# Patient Record
Sex: Male | Born: 1955
Health system: Southern US, Community
[De-identification: ages and names within clinical notes are randomized; demographics above are authoritative.]

## PROBLEM LIST (undated history)

## (undated) DIAGNOSIS — I739 Peripheral vascular disease, unspecified: Secondary | ICD-10-CM

## (undated) DIAGNOSIS — I2699 Other pulmonary embolism without acute cor pulmonale: Secondary | ICD-10-CM

## (undated) DIAGNOSIS — I10 Essential (primary) hypertension: Secondary | ICD-10-CM

## (undated) DIAGNOSIS — Z87442 Personal history of urinary calculi: Secondary | ICD-10-CM

## (undated) DIAGNOSIS — E079 Disorder of thyroid, unspecified: Secondary | ICD-10-CM

## (undated) DIAGNOSIS — C801 Malignant (primary) neoplasm, unspecified: Secondary | ICD-10-CM

## (undated) DIAGNOSIS — R0600 Dyspnea, unspecified: Secondary | ICD-10-CM

## (undated) DIAGNOSIS — C4491 Basal cell carcinoma of skin, unspecified: Secondary | ICD-10-CM

## (undated) DIAGNOSIS — E039 Hypothyroidism, unspecified: Secondary | ICD-10-CM

## (undated) DIAGNOSIS — N189 Chronic kidney disease, unspecified: Secondary | ICD-10-CM

## (undated) DIAGNOSIS — M1990 Unspecified osteoarthritis, unspecified site: Secondary | ICD-10-CM

## (undated) HISTORY — PX: NO PAST SURGERIES: SHX2092

## (undated) HISTORY — DX: Other pulmonary embolism without acute cor pulmonale: I26.99

## (undated) HISTORY — DX: Malignant (primary) neoplasm, unspecified: C80.1

## (undated) HISTORY — DX: Disorder of thyroid, unspecified: E07.9

## (undated) HISTORY — DX: Basal cell carcinoma of skin, unspecified: C44.91

## (undated) HISTORY — PX: OTHER SURGICAL HISTORY: SHX169

## (undated) HISTORY — DX: Chronic kidney disease, unspecified: N18.9

---

## 1990-07-14 HISTORY — PX: WISDOM TOOTH EXTRACTION: SHX21

## 2009-10-30 ENCOUNTER — Ambulatory Visit: Payer: Self-pay | Admitting: Internal Medicine

## 2009-10-30 LAB — CBC WITH DIFFERENTIAL/PLATELET
BASO%: 2 % (ref 0.0–2.0)
Basophils Absolute: 0.1 10*3/uL (ref 0.0–0.1)
EOS%: 1.7 % (ref 0.0–7.0)
Eosinophils Absolute: 0.1 10*3/uL (ref 0.0–0.5)
HCT: 48 % (ref 38.4–49.9)
HGB: 16.7 g/dL (ref 13.0–17.1)
LYMPH%: 28.5 % (ref 14.0–49.0)
MCH: 34 pg — ABNORMAL HIGH (ref 27.2–33.4)
MCHC: 34.8 g/dL (ref 32.0–36.0)
MCV: 97.5 fL (ref 79.3–98.0)
MONO#: 0.6 10*3/uL (ref 0.1–0.9)
MONO%: 9.7 % (ref 0.0–14.0)
NEUT#: 3.6 10*3/uL (ref 1.5–6.5)
NEUT%: 58.1 % (ref 39.0–75.0)
Platelets: 254 10*3/uL (ref 140–400)
RBC: 4.92 10*6/uL (ref 4.20–5.82)
RDW: 12.6 % (ref 11.0–14.6)
WBC: 6.2 10*3/uL (ref 4.0–10.3)
lymph#: 1.8 10*3/uL (ref 0.9–3.3)

## 2009-11-01 LAB — COMPREHENSIVE METABOLIC PANEL
ALT: 24 U/L (ref 0–53)
AST: 19 U/L (ref 0–37)
Albumin: 4.4 g/dL (ref 3.5–5.2)
Alkaline Phosphatase: 52 U/L (ref 39–117)
BUN: 23 mg/dL (ref 6–23)
CO2: 21 mEq/L (ref 19–32)
Calcium: 9 mg/dL (ref 8.4–10.5)
Chloride: 104 mEq/L (ref 96–112)
Creatinine, Ser: 1.52 mg/dL — ABNORMAL HIGH (ref 0.40–1.50)
Glucose, Bld: 125 mg/dL — ABNORMAL HIGH (ref 70–99)
Potassium: 4.5 mEq/L (ref 3.5–5.3)
Sodium: 138 mEq/L (ref 135–145)
Total Bilirubin: 0.6 mg/dL (ref 0.3–1.2)
Total Protein: 7.4 g/dL (ref 6.0–8.3)

## 2009-11-01 LAB — KAPPA/LAMBDA LIGHT CHAINS
Kappa free light chain: 1 mg/dL (ref 0.33–1.94)
Kappa:Lambda Ratio: 1.19 (ref 0.26–1.65)
Lambda Free Lght Chn: 0.84 mg/dL (ref 0.57–2.63)

## 2009-11-01 LAB — LACTATE DEHYDROGENASE: LDH: 167 U/L (ref 94–250)

## 2009-11-01 LAB — BETA 2 MICROGLOBULIN, SERUM: Beta-2 Microglobulin: 1.74 mg/L — ABNORMAL HIGH (ref 1.01–1.73)

## 2009-11-01 LAB — IMMUNOFIXATION ELECTROPHORESIS
IgA: 388 mg/dL — ABNORMAL HIGH (ref 68–378)
IgG (Immunoglobin G), Serum: 1340 mg/dL (ref 694–1618)
IgM, Serum: 154 mg/dL (ref 60–263)
Total Protein, Serum Electrophoresis: 7.4 g/dL (ref 6.0–8.3)

## 2009-11-07 LAB — UIFE/LIGHT CHAINS/TP QN, 24-HR UR
Albumin, U: DETECTED
Alpha 1, Urine: DETECTED — AB
Alpha 2, Urine: DETECTED — AB
Beta, Urine: DETECTED — AB
Free Kappa Lt Chains,Ur: 3.21 mg/dL — ABNORMAL HIGH (ref 0.04–1.51)
Free Kappa/Lambda Ratio: 16.89 ratio — ABNORMAL HIGH (ref 0.46–4.00)
Free Lambda Excretion/Day: 2.57 mg/d
Free Lambda Lt Chains,Ur: 0.19 mg/dL (ref 0.08–1.01)
Free Lt Chn Excr Rate: 43.34 mg/d
Gamma Globulin, Urine: DETECTED — AB
Time: 24 hours
Total Protein, Urine-Ur/day: 53 mg/d (ref 10–140)
Total Protein, Urine: 3.9 mg/dL
Volume, Urine: 1350 mL

## 2011-10-01 DIAGNOSIS — Z860100 Personal history of colon polyps, unspecified: Secondary | ICD-10-CM

## 2011-10-01 HISTORY — DX: Personal history of colon polyps, unspecified: Z86.0100

## 2011-10-01 HISTORY — DX: Personal history of colonic polyps: Z86.010

## 2013-07-14 DIAGNOSIS — I2699 Other pulmonary embolism without acute cor pulmonale: Secondary | ICD-10-CM | POA: Insufficient documentation

## 2013-07-14 HISTORY — DX: Other pulmonary embolism without acute cor pulmonale: I26.99

## 2015-07-30 DIAGNOSIS — E088 Diabetes mellitus due to underlying condition with unspecified complications: Secondary | ICD-10-CM

## 2015-07-30 HISTORY — DX: Diabetes mellitus due to underlying condition with unspecified complications: E08.8

## 2015-11-26 ENCOUNTER — Encounter: Payer: Self-pay | Admitting: Gastroenterology

## 2015-11-28 HISTORY — PX: COLONOSCOPY: SHX174

## 2016-02-01 DIAGNOSIS — Z7901 Long term (current) use of anticoagulants: Secondary | ICD-10-CM | POA: Diagnosis not present

## 2016-02-01 DIAGNOSIS — Z86718 Personal history of other venous thrombosis and embolism: Secondary | ICD-10-CM | POA: Diagnosis not present

## 2016-02-01 DIAGNOSIS — Z832 Family history of diseases of the blood and blood-forming organs and certain disorders involving the immune mechanism: Secondary | ICD-10-CM | POA: Diagnosis not present

## 2016-02-01 DIAGNOSIS — Z86711 Personal history of pulmonary embolism: Secondary | ICD-10-CM | POA: Diagnosis not present

## 2017-04-07 ENCOUNTER — Ambulatory Visit (INDEPENDENT_AMBULATORY_CARE_PROVIDER_SITE_OTHER): Payer: BLUE CROSS/BLUE SHIELD | Admitting: Cardiology

## 2017-04-07 ENCOUNTER — Encounter: Payer: Self-pay | Admitting: Cardiology

## 2017-04-07 VITALS — BP 134/72 | HR 90 | Ht 68.0 in | Wt 181.0 lb

## 2017-04-07 DIAGNOSIS — E782 Mixed hyperlipidemia: Secondary | ICD-10-CM | POA: Diagnosis not present

## 2017-04-07 DIAGNOSIS — E088 Diabetes mellitus due to underlying condition with unspecified complications: Secondary | ICD-10-CM

## 2017-04-07 DIAGNOSIS — E785 Hyperlipidemia, unspecified: Secondary | ICD-10-CM

## 2017-04-07 DIAGNOSIS — I2699 Other pulmonary embolism without acute cor pulmonale: Secondary | ICD-10-CM | POA: Diagnosis not present

## 2017-04-07 HISTORY — DX: Hyperlipidemia, unspecified: E78.5

## 2017-04-07 NOTE — Progress Notes (Signed)
Cardiology Office Note:    Date:  04/07/2017   ID:  [name], DOB 01-20-1956, MRN [id number]  PCP:  Nicoletta Dress, MD  Cardiologist:  Jenean Lindau, MD   Referring MD: Nicoletta Dress, MD    ASSESSMENT:    1. Other pulmonary embolism without acute cor pulmonale, unspecified chronicity ([place])   2. Diabetes mellitus due to underlying condition with complication, unspecified whether long term insulin use ([place])   3. Mixed hyperlipidemia    PLAN:    In order of problems listed above:  1. Pulmonary embolism issues were discussed. Patient is on anticoagulation Benefits and potential this explained and he vocalized understanding. 2. I discussed with the patient that his blood pressure heart rate is mildly elevated. His wife is a Marine scientist and he will keep her track of this in the next 2 weeks and send me her record. He mentions to me currently. His blood pressures and heart rate are much better than what are obtained in our clinic today. 3. Diet was discussed for diabetes mellitus and dyslipidemia. Importance of regular exercise stressed. These are followed by his primary care physician. Patient will be seen in follow-up appointment on an annual basis or earlier if he has any concerns.   Medication Adjustments/Labs and Tests Ordered: Current medicines are reviewed at length with the patient today.  Concerns regarding medicines are outlined above.  No orders of the defined types were placed in this encounter.  No orders of the defined types were placed in this encounter.    History of Present Illness:    Brandon Norman is a 61 y.o. male who is being seen today for the evaluation of follow up for pulmonary embolism history at the request of Nicoletta Dress, MD. Patient is a pleasant 61 year old male. He has past medical history of pulmonary embolism.Patient also has history of diabetes mellitus and dyslipidemia. He mentions to me that he walks more than 2.5 miles a day on  a daily basis. No chest pain orthopnea or PND. At the time of my evaluation is alert awake oriented and in no distress. I have taken care of this gentleman in the past and now he wants to be established with me for his care  Past Medical History:  Diagnosis Date  . Pulmonary embolism ([place]) 2015    History reviewed. No pertinent surgical history.  Current Medications: Current Meds  Medication Sig  . atorvastatin (LIPITOR) 20 MG tablet Take 20 mg by mouth daily.  . fenofibrate 160 MG tablet Take 160 mg by mouth daily.  . Levothyroxine Sodium 75 MCG CAPS Take 75 mcg by mouth daily.  . rivaroxaban (XARELTO) 20 MG TABS tablet Take 20 mg by mouth daily.  . traMADol (ULTRAM) 50 MG tablet Take 50 mg by mouth daily as needed.     Allergies:   Patient has no known allergies.   Social History   Social History  . Marital status: Married    Spouse name: N/A  . Number of children: N/A  . Years of education: N/A   Social History Main Topics  . Smoking status: Never Smoker  . Smokeless tobacco: Never Used  . Alcohol use Yes     Comment: occ  . Drug use: No  . Sexual activity: Not Asked   Other Topics Concern  . None   Social History Narrative  . None     Family History: The patient's family history includes Heart attack in his mother.  ROS:   Please see the history of present illness.    All other systems reviewed and are negative.  EKGs/Labs/Other Studies Reviewed:    The following studies were reviewed today: I discussed previous evaluation and reviewed records with the patient at length. He recently had blood work with his primary care physician and we will send me a copy of it.   Recent Labs: No results found for requested labs within last 8760 hours.  Recent Lipid Panel No results found for: CHOL, TRIG, HDL, CHOLHDL, VLDL, LDLCALC, LDLDIRECT  Physical Exam:    VS:  BP 134/72   Pulse 90   Ht 5\' 8"  (1.727 m)   Wt 181 lb 0.6 oz (82.1 kg)   SpO2 98%   BMI 27.53  kg/m     Wt Readings from Last 3 Encounters:  04/07/17 181 lb 0.6 oz (82.1 kg)     GEN: Patient is in no acute distress HEENT: Normal NECK: No JVD; No carotid bruits LYMPHATICS: No lymphadenopathy CARDIAC: S1 S2 regular, 2/6 systolic murmur at the apex. RESPIRATORY:  Clear to auscultation without rales, wheezing or rhonchi  ABDOMEN: Soft, non-tender, non-distended MUSCULOSKELETAL:  No edema; No deformity  SKIN: Warm and dry NEUROLOGIC:  Alert and oriented x 3 PSYCHIATRIC:  Normal affect    Signed, Jenean Lindau, MD  04/07/2017 9:27 AM    [place]

## 2017-04-07 NOTE — Patient Instructions (Signed)
Medication Instructions:  No med changes  Labwork: None  Testing/Procedures: You had an EKG today  Follow-Up: 1 year  Any Other Special Instructions Will Be Listed Below (If Applicable).     If you need a refill on your cardiac medications before your next appointment, please call your pharmacy.

## 2017-05-09 ENCOUNTER — Encounter: Payer: Self-pay | Admitting: Cardiology

## 2018-08-25 DIAGNOSIS — E1122 Type 2 diabetes mellitus with diabetic chronic kidney disease: Secondary | ICD-10-CM | POA: Diagnosis not present

## 2018-08-25 DIAGNOSIS — Z85828 Personal history of other malignant neoplasm of skin: Secondary | ICD-10-CM | POA: Diagnosis not present

## 2018-08-25 DIAGNOSIS — L814 Other melanin hyperpigmentation: Secondary | ICD-10-CM | POA: Diagnosis not present

## 2018-08-25 DIAGNOSIS — D485 Neoplasm of uncertain behavior of skin: Secondary | ICD-10-CM | POA: Diagnosis not present

## 2018-08-25 DIAGNOSIS — D224 Melanocytic nevi of scalp and neck: Secondary | ICD-10-CM | POA: Diagnosis not present

## 2018-08-25 DIAGNOSIS — N183 Chronic kidney disease, stage 3 (moderate): Secondary | ICD-10-CM | POA: Diagnosis not present

## 2018-08-25 DIAGNOSIS — C44311 Basal cell carcinoma of skin of nose: Secondary | ICD-10-CM | POA: Diagnosis not present

## 2018-08-25 DIAGNOSIS — E785 Hyperlipidemia, unspecified: Secondary | ICD-10-CM | POA: Diagnosis not present

## 2018-08-25 DIAGNOSIS — L570 Actinic keratosis: Secondary | ICD-10-CM | POA: Diagnosis not present

## 2018-08-25 DIAGNOSIS — Z08 Encounter for follow-up examination after completed treatment for malignant neoplasm: Secondary | ICD-10-CM | POA: Diagnosis not present

## 2018-10-19 DIAGNOSIS — N183 Chronic kidney disease, stage 3 (moderate): Secondary | ICD-10-CM | POA: Diagnosis not present

## 2018-10-19 DIAGNOSIS — I2692 Saddle embolus of pulmonary artery without acute cor pulmonale: Secondary | ICD-10-CM | POA: Diagnosis not present

## 2018-10-19 DIAGNOSIS — E039 Hypothyroidism, unspecified: Secondary | ICD-10-CM | POA: Diagnosis not present

## 2018-10-19 DIAGNOSIS — E1169 Type 2 diabetes mellitus with other specified complication: Secondary | ICD-10-CM | POA: Diagnosis not present

## 2018-10-19 DIAGNOSIS — E785 Hyperlipidemia, unspecified: Secondary | ICD-10-CM | POA: Diagnosis not present

## 2018-12-01 ENCOUNTER — Ambulatory Visit: Payer: BLUE CROSS/BLUE SHIELD | Admitting: Cardiology

## 2018-12-15 DIAGNOSIS — C44311 Basal cell carcinoma of skin of nose: Secondary | ICD-10-CM | POA: Diagnosis not present

## 2018-12-15 DIAGNOSIS — D485 Neoplasm of uncertain behavior of skin: Secondary | ICD-10-CM | POA: Diagnosis not present

## 2018-12-15 DIAGNOSIS — L989 Disorder of the skin and subcutaneous tissue, unspecified: Secondary | ICD-10-CM | POA: Diagnosis not present

## 2019-01-21 DIAGNOSIS — D225 Melanocytic nevi of trunk: Secondary | ICD-10-CM | POA: Diagnosis not present

## 2019-01-21 DIAGNOSIS — L738 Other specified follicular disorders: Secondary | ICD-10-CM | POA: Diagnosis not present

## 2019-01-21 DIAGNOSIS — L821 Other seborrheic keratosis: Secondary | ICD-10-CM | POA: Diagnosis not present

## 2019-01-21 DIAGNOSIS — L905 Scar conditions and fibrosis of skin: Secondary | ICD-10-CM | POA: Diagnosis not present

## 2019-01-21 DIAGNOSIS — Z85828 Personal history of other malignant neoplasm of skin: Secondary | ICD-10-CM | POA: Diagnosis not present

## 2019-01-21 DIAGNOSIS — L814 Other melanin hyperpigmentation: Secondary | ICD-10-CM | POA: Diagnosis not present

## 2019-01-21 DIAGNOSIS — D485 Neoplasm of uncertain behavior of skin: Secondary | ICD-10-CM | POA: Diagnosis not present

## 2019-01-21 DIAGNOSIS — D1801 Hemangioma of skin and subcutaneous tissue: Secondary | ICD-10-CM | POA: Diagnosis not present

## 2019-01-21 DIAGNOSIS — D2239 Melanocytic nevi of other parts of face: Secondary | ICD-10-CM | POA: Diagnosis not present

## 2019-01-26 ENCOUNTER — Ambulatory Visit: Payer: Self-pay | Admitting: Cardiology

## 2019-01-31 DIAGNOSIS — N183 Chronic kidney disease, stage 3 (moderate): Secondary | ICD-10-CM | POA: Diagnosis not present

## 2019-01-31 DIAGNOSIS — E785 Hyperlipidemia, unspecified: Secondary | ICD-10-CM | POA: Diagnosis not present

## 2019-01-31 DIAGNOSIS — I2692 Saddle embolus of pulmonary artery without acute cor pulmonale: Secondary | ICD-10-CM | POA: Diagnosis not present

## 2019-01-31 DIAGNOSIS — Z9181 History of falling: Secondary | ICD-10-CM | POA: Diagnosis not present

## 2019-01-31 DIAGNOSIS — E039 Hypothyroidism, unspecified: Secondary | ICD-10-CM | POA: Diagnosis not present

## 2019-01-31 DIAGNOSIS — E1169 Type 2 diabetes mellitus with other specified complication: Secondary | ICD-10-CM | POA: Diagnosis not present

## 2019-02-03 ENCOUNTER — Encounter: Payer: Self-pay | Admitting: Cardiology

## 2019-02-03 ENCOUNTER — Other Ambulatory Visit: Payer: Self-pay

## 2019-02-03 ENCOUNTER — Ambulatory Visit (INDEPENDENT_AMBULATORY_CARE_PROVIDER_SITE_OTHER): Payer: Medicare Other | Admitting: Cardiology

## 2019-02-03 VITALS — BP 142/76 | HR 92 | Ht 68.0 in | Wt 179.0 lb

## 2019-02-03 DIAGNOSIS — Z86711 Personal history of pulmonary embolism: Secondary | ICD-10-CM

## 2019-02-03 DIAGNOSIS — R011 Cardiac murmur, unspecified: Secondary | ICD-10-CM | POA: Diagnosis not present

## 2019-02-03 DIAGNOSIS — E782 Mixed hyperlipidemia: Secondary | ICD-10-CM | POA: Diagnosis not present

## 2019-02-03 DIAGNOSIS — E088 Diabetes mellitus due to underlying condition with unspecified complications: Secondary | ICD-10-CM

## 2019-02-03 HISTORY — DX: Personal history of pulmonary embolism: Z86.711

## 2019-02-03 NOTE — Progress Notes (Signed)
Cardiology Office Note:    Date:  02/03/2019   ID:  Brandon Norman, DOB Feb 24, 1956, MRN [id number]  PCP:  Brandon Dress, MD  Cardiologist:  Brandon Lindau, MD   Referring MD: Brandon Dress, MD    ASSESSMENT:    1. Mixed hyperlipidemia   2. History of pulmonary embolism   3. Diabetes mellitus due to underlying condition with unspecified complications ([place])    PLAN:    In order of problems listed above:  1. Primary prevention stressed with the patient.  Importance of compliance with diet and medication stressed and he vocalized understanding.  We will get a copy of all lab work done by his primary care physician. 2. Echocardiogram will be done to assess murmur heard on auscultation. 3. He continues taking his anticoagulation for history of a significant pulmonary embolism.  His blood work is monitored by his primary care physician. 4. Patient will be seen in follow-up appointment in 12 months or earlier if the patient has any concerns    Medication Adjustments/Labs and Tests Ordered: Current medicines are reviewed at length with the patient today.  Concerns regarding medicines are outlined above.  No orders of the defined types were placed in this encounter.  No orders of the defined types were placed in this encounter.    No chief complaint on file.    History of Present Illness:    Brandon Norman is a 63 y.o. male.  Patient has history of pulmonary embolism and dyslipidemia.  He denies any problems at this time and takes care of activities of daily living.  No chest pain orthopnea or PND.  He walks 2-1/2 miles on a daily basis without any symptoms.  At the time of my evaluation, the patient is alert awake oriented and in no distress.  Past Medical History:  Diagnosis Date  . Pulmonary embolism ([place]) 2015    History reviewed. No pertinent surgical history.  Current Medications: Current Meds  Medication Sig  . atorvastatin (LIPITOR) 20 MG tablet Take  20 mg by mouth daily.  . fenofibrate 160 MG tablet Take 160 mg by mouth daily.  Marland Kitchen glimepiride (AMARYL) 2 MG tablet Take 1 tablet by mouth daily.  . Levothyroxine Sodium 75 MCG CAPS Take 75 mcg by mouth daily.  . rivaroxaban (XARELTO) 20 MG TABS tablet Take 20 mg by mouth daily.     Allergies:   Patient has no known allergies.   Social History   Socioeconomic History  . Marital status: Married    Spouse name: Not on file  . Number of children: Not on file  . Years of education: Not on file  . Highest education level: Not on file  Occupational History  . Not on file  Social Needs  . Financial resource strain: Not on file  . Food insecurity    Worry: Not on file    Inability: Not on file  . Transportation needs    Medical: Not on file    Non-medical: Not on file  Tobacco Use  . Smoking status: Never Smoker  . Smokeless tobacco: Never Used  Substance and Sexual Activity  . Alcohol use: Yes    Comment: occ  . Drug use: No  . Sexual activity: Not on file  Lifestyle  . Physical activity    Days per week: Not on file    Minutes per session: Not on file  . Stress: Not on file  Relationships  . Social connections  Talks on phone: Not on file    Gets together: Not on file    Attends religious service: Not on file    Active member of club or organization: Not on file    Attends meetings of clubs or organizations: Not on file    Relationship status: Not on file  Other Topics Concern  . Not on file  Social History Narrative  . Not on file     Family History: The patient's family history includes Heart attack in his mother.  ROS:   Please see the history of present illness.    All other systems reviewed and are negative.  EKGs/Labs/Other Studies Reviewed:    The following studies were reviewed today: We are requesting lab work done recently at primary care office.   Recent Labs: No results found for requested labs within last 8760 hours.  Recent Lipid Panel No  results found for: CHOL, TRIG, HDL, CHOLHDL, VLDL, LDLCALC, LDLDIRECT  Physical Exam:    VS:  BP (!) 142/76 (BP Location: Left Arm, Patient Position: Sitting, Cuff Size: Normal)   Pulse 92   Ht 5\' 8"  (1.727 m)   Wt 179 lb (81.2 kg)   SpO2 98%   BMI 27.22 kg/m     Wt Readings from Last 3 Encounters:  02/03/19 179 lb (81.2 kg)  04/07/17 181 lb 0.6 oz (82.1 kg)     GEN: Patient is in no acute distress HEENT: Normal NECK: No JVD; No carotid bruits LYMPHATICS: No lymphadenopathy CARDIAC: Hear sounds regular, 2/6 systolic murmur at the apex. RESPIRATORY:  Clear to auscultation without rales, wheezing or rhonchi  ABDOMEN: Soft, non-tender, non-distended MUSCULOSKELETAL:  No edema; No deformity  SKIN: Warm and dry NEUROLOGIC:  Alert and oriented x 3 PSYCHIATRIC:  Normal affect   Signed, Brandon Lindau, MD  02/03/2019 2:46 PM    [facility]

## 2019-02-03 NOTE — Addendum Note (Signed)
Addended by: Beckey Rutter on: 02/03/2019 03:14 PM   Modules accepted: Orders

## 2019-02-03 NOTE — Patient Instructions (Addendum)
Medication Instructions:  Your physician recommends that you continue on your current medications as directed. Please refer to the Current Medication list given to you today.  If you need a refill on your cardiac medications before your next appointment, please call your pharmacy.   Lab work: NONE If you have labs (blood work) drawn today and your tests are completely normal, you will receive your results only by: Marland Kitchen MyChart Message (if you have MyChart) OR . A paper copy in the mail If you have any lab test that is abnormal or we need to change your treatment, we will call you to review the results.  Testing/Procedures: YOU had an EKG performed today.    Your physician has requested that you have an echocardiogram. Echocardiography is a painless test that uses sound waves to create images of your heart. It provides your doctor with information about the size and shape of your heart and how well your heart's chambers and valves are working. This procedure takes approximately one hour. There are no restrictions for this procedure.    Follow-Up: At [facility], you and your health needs are our priority.  As part of our continuing mission to provide you with exceptional heart care, we have created designated Provider Care Teams.  These Care Teams include your primary Cardiologist (physician) and Advanced Practice Providers (APPs -  Physician Assistants and Nurse Practitioners) who all work together to provide you with the care you need, when you need it. You will need a follow up appointment in 12 months.     Any Other Special Instructions Will Be Listed Below   Echocardiogram An echocardiogram is a procedure that uses painless sound waves (ultrasound) to produce an image of the heart. Images from an echocardiogram can provide important information about:  Signs of coronary artery disease (CAD).  Aneurysm detection. An aneurysm is a weak or damaged part of an artery wall that bulges  out from the normal force of blood pumping through the body.  Heart size and shape. Changes in the size or shape of the heart can be associated with certain conditions, including heart failure, aneurysm, and CAD.  Heart muscle function.  Heart valve function.  Signs of a past heart attack.  Fluid buildup around the heart.  Thickening of the heart muscle.  A tumor or infectious growth around the heart valves. Tell a health care provider about:  Any allergies you have.  All medicines you are taking, including vitamins, herbs, eye drops, creams, and over-the-counter medicines.  Any blood disorders you have.  Any surgeries you have had.  Any medical conditions you have.  Whether you are pregnant or may be pregnant. What are the risks? Generally, this is a safe procedure. However, problems may occur, including:  Allergic reaction to dye (contrast) that may be used during the procedure. What happens before the procedure? No specific preparation is needed. You may eat and drink normally. What happens during the procedure?   An IV tube may be inserted into one of your veins.  You may receive contrast through this tube. A contrast is an injection that improves the quality of the pictures from your heart.  A gel will be applied to your chest.  A wand-like tool (transducer) will be moved over your chest. The gel will help to transmit the sound waves from the transducer.  The sound waves will harmlessly bounce off of your heart to allow the heart images to be captured in real-time motion. The images will  be recorded on a computer. The procedure may vary among health care providers and hospitals. What happens after the procedure?  You may return to your normal, everyday life, including diet, activities, and medicines, unless your health care provider tells you not to do that. Summary  An echocardiogram is a procedure that uses painless sound waves (ultrasound) to produce an  image of the heart.  Images from an echocardiogram can provide important information about the size and shape of your heart, heart muscle function, heart valve function, and fluid buildup around your heart.  You do not need to do anything to prepare before this procedure. You may eat and drink normally.  After the echocardiogram is completed, you may return to your normal, everyday life, unless your health care provider tells you not to do that. This information is not intended to replace advice given to you by your health care provider. Make sure you discuss any questions you have with your health care provider. Document Released: 06/27/2000 Document Revised: 10/21/2018 Document Reviewed: 08/02/2016 Elsevier Patient Education  2020 [employer].

## 2019-03-31 ENCOUNTER — Other Ambulatory Visit: Payer: BLUE CROSS/BLUE SHIELD

## 2019-04-07 ENCOUNTER — Other Ambulatory Visit: Payer: Self-pay

## 2019-04-07 ENCOUNTER — Telehealth: Payer: Self-pay

## 2019-04-07 ENCOUNTER — Ambulatory Visit (INDEPENDENT_AMBULATORY_CARE_PROVIDER_SITE_OTHER): Payer: Medicare Other

## 2019-04-07 DIAGNOSIS — R011 Cardiac murmur, unspecified: Secondary | ICD-10-CM | POA: Diagnosis not present

## 2019-04-07 NOTE — Telephone Encounter (Signed)
-----   Message from Jenean Lindau, MD sent at 04/07/2019  2:06 PM EDT ----- The results of the study is unremarkable. Please inform patient. I will discuss in detail at next appointment. Cc  primary care/referring physician Jenean Lindau, MD 04/07/2019 2:06 PM

## 2019-04-07 NOTE — Progress Notes (Signed)
Complete echocardiogram has been performed.  Jimmy Kharon Hixon RDCS, RVT 

## 2019-04-07 NOTE — Telephone Encounter (Signed)
Information relayed, copy sent to Dr. Delena Bali per Dr. Docia Furl request.

## 2019-04-08 DIAGNOSIS — I129 Hypertensive chronic kidney disease with stage 1 through stage 4 chronic kidney disease, or unspecified chronic kidney disease: Secondary | ICD-10-CM | POA: Diagnosis not present

## 2019-04-08 DIAGNOSIS — N183 Chronic kidney disease, stage 3 (moderate): Secondary | ICD-10-CM | POA: Diagnosis not present

## 2019-04-08 DIAGNOSIS — E039 Hypothyroidism, unspecified: Secondary | ICD-10-CM | POA: Diagnosis not present

## 2019-04-08 DIAGNOSIS — N189 Chronic kidney disease, unspecified: Secondary | ICD-10-CM | POA: Diagnosis not present

## 2019-04-08 DIAGNOSIS — N2581 Secondary hyperparathyroidism of renal origin: Secondary | ICD-10-CM | POA: Diagnosis not present

## 2019-04-08 DIAGNOSIS — R7303 Prediabetes: Secondary | ICD-10-CM | POA: Diagnosis not present

## 2019-04-08 DIAGNOSIS — E785 Hyperlipidemia, unspecified: Secondary | ICD-10-CM | POA: Diagnosis not present

## 2019-04-08 DIAGNOSIS — I2699 Other pulmonary embolism without acute cor pulmonale: Secondary | ICD-10-CM | POA: Diagnosis not present

## 2019-05-09 DIAGNOSIS — E785 Hyperlipidemia, unspecified: Secondary | ICD-10-CM | POA: Diagnosis not present

## 2019-05-09 DIAGNOSIS — E1169 Type 2 diabetes mellitus with other specified complication: Secondary | ICD-10-CM | POA: Diagnosis not present

## 2019-05-09 DIAGNOSIS — N183 Chronic kidney disease, stage 3 unspecified: Secondary | ICD-10-CM | POA: Diagnosis not present

## 2019-05-09 DIAGNOSIS — Z23 Encounter for immunization: Secondary | ICD-10-CM | POA: Diagnosis not present

## 2019-05-09 DIAGNOSIS — E039 Hypothyroidism, unspecified: Secondary | ICD-10-CM | POA: Diagnosis not present

## 2019-05-09 DIAGNOSIS — Z125 Encounter for screening for malignant neoplasm of prostate: Secondary | ICD-10-CM | POA: Diagnosis not present

## 2019-05-23 DIAGNOSIS — Z23 Encounter for immunization: Secondary | ICD-10-CM | POA: Diagnosis not present

## 2019-08-16 DIAGNOSIS — E785 Hyperlipidemia, unspecified: Secondary | ICD-10-CM | POA: Diagnosis not present

## 2019-08-16 DIAGNOSIS — E039 Hypothyroidism, unspecified: Secondary | ICD-10-CM | POA: Diagnosis not present

## 2019-08-16 DIAGNOSIS — Z1331 Encounter for screening for depression: Secondary | ICD-10-CM | POA: Diagnosis not present

## 2019-08-16 DIAGNOSIS — E663 Overweight: Secondary | ICD-10-CM | POA: Diagnosis not present

## 2019-08-16 DIAGNOSIS — Z9181 History of falling: Secondary | ICD-10-CM | POA: Diagnosis not present

## 2019-08-16 DIAGNOSIS — N183 Chronic kidney disease, stage 3 unspecified: Secondary | ICD-10-CM | POA: Diagnosis not present

## 2019-08-16 DIAGNOSIS — E1169 Type 2 diabetes mellitus with other specified complication: Secondary | ICD-10-CM | POA: Diagnosis not present

## 2019-08-19 DIAGNOSIS — E1169 Type 2 diabetes mellitus with other specified complication: Secondary | ICD-10-CM | POA: Diagnosis not present

## 2019-11-08 DIAGNOSIS — E1122 Type 2 diabetes mellitus with diabetic chronic kidney disease: Secondary | ICD-10-CM | POA: Diagnosis not present

## 2019-11-08 DIAGNOSIS — Z86711 Personal history of pulmonary embolism: Secondary | ICD-10-CM | POA: Diagnosis not present

## 2019-11-08 DIAGNOSIS — N1832 Chronic kidney disease, stage 3b: Secondary | ICD-10-CM | POA: Diagnosis not present

## 2019-11-08 DIAGNOSIS — N201 Calculus of ureter: Secondary | ICD-10-CM | POA: Diagnosis not present

## 2019-11-08 DIAGNOSIS — N132 Hydronephrosis with renal and ureteral calculous obstruction: Secondary | ICD-10-CM | POA: Diagnosis not present

## 2019-11-08 DIAGNOSIS — Z87442 Personal history of urinary calculi: Secondary | ICD-10-CM | POA: Diagnosis not present

## 2019-11-14 DIAGNOSIS — N183 Chronic kidney disease, stage 3 unspecified: Secondary | ICD-10-CM | POA: Diagnosis not present

## 2019-11-14 DIAGNOSIS — E785 Hyperlipidemia, unspecified: Secondary | ICD-10-CM | POA: Diagnosis not present

## 2019-11-14 DIAGNOSIS — E039 Hypothyroidism, unspecified: Secondary | ICD-10-CM | POA: Diagnosis not present

## 2019-11-14 DIAGNOSIS — E1169 Type 2 diabetes mellitus with other specified complication: Secondary | ICD-10-CM | POA: Diagnosis not present

## 2019-11-15 DIAGNOSIS — Z79899 Other long term (current) drug therapy: Secondary | ICD-10-CM | POA: Diagnosis not present

## 2019-11-15 DIAGNOSIS — N1330 Unspecified hydronephrosis: Secondary | ICD-10-CM | POA: Diagnosis not present

## 2019-11-15 DIAGNOSIS — N401 Enlarged prostate with lower urinary tract symptoms: Secondary | ICD-10-CM | POA: Diagnosis not present

## 2019-11-15 DIAGNOSIS — R109 Unspecified abdominal pain: Secondary | ICD-10-CM | POA: Diagnosis not present

## 2019-11-15 DIAGNOSIS — N201 Calculus of ureter: Secondary | ICD-10-CM | POA: Diagnosis not present

## 2019-11-22 DIAGNOSIS — Z1159 Encounter for screening for other viral diseases: Secondary | ICD-10-CM | POA: Diagnosis not present

## 2019-11-22 DIAGNOSIS — N201 Calculus of ureter: Secondary | ICD-10-CM | POA: Diagnosis not present

## 2019-11-22 DIAGNOSIS — Z1152 Encounter for screening for COVID-19: Secondary | ICD-10-CM | POA: Diagnosis not present

## 2019-11-22 DIAGNOSIS — R1032 Left lower quadrant pain: Secondary | ICD-10-CM | POA: Diagnosis not present

## 2019-11-25 DIAGNOSIS — N201 Calculus of ureter: Secondary | ICD-10-CM | POA: Diagnosis not present

## 2019-11-25 DIAGNOSIS — N200 Calculus of kidney: Secondary | ICD-10-CM | POA: Diagnosis not present

## 2019-11-25 DIAGNOSIS — E119 Type 2 diabetes mellitus without complications: Secondary | ICD-10-CM | POA: Diagnosis not present

## 2019-11-25 DIAGNOSIS — N202 Calculus of kidney with calculus of ureter: Secondary | ICD-10-CM | POA: Diagnosis not present

## 2019-11-28 DIAGNOSIS — E785 Hyperlipidemia, unspecified: Secondary | ICD-10-CM | POA: Diagnosis not present

## 2019-11-28 DIAGNOSIS — N184 Chronic kidney disease, stage 4 (severe): Secondary | ICD-10-CM | POA: Diagnosis not present

## 2019-11-28 DIAGNOSIS — I824Z1 Acute embolism and thrombosis of unspecified deep veins of right distal lower extremity: Secondary | ICD-10-CM | POA: Diagnosis not present

## 2019-11-28 DIAGNOSIS — N189 Chronic kidney disease, unspecified: Secondary | ICD-10-CM | POA: Diagnosis not present

## 2019-11-28 DIAGNOSIS — I82451 Acute embolism and thrombosis of right peroneal vein: Secondary | ICD-10-CM | POA: Diagnosis not present

## 2019-11-28 DIAGNOSIS — E039 Hypothyroidism, unspecified: Secondary | ICD-10-CM | POA: Diagnosis not present

## 2019-11-28 DIAGNOSIS — M79604 Pain in right leg: Secondary | ICD-10-CM | POA: Diagnosis not present

## 2019-11-28 DIAGNOSIS — E1122 Type 2 diabetes mellitus with diabetic chronic kidney disease: Secondary | ICD-10-CM | POA: Diagnosis not present

## 2019-11-28 DIAGNOSIS — I129 Hypertensive chronic kidney disease with stage 1 through stage 4 chronic kidney disease, or unspecified chronic kidney disease: Secondary | ICD-10-CM | POA: Diagnosis not present

## 2019-11-28 DIAGNOSIS — I82441 Acute embolism and thrombosis of right tibial vein: Secondary | ICD-10-CM | POA: Diagnosis not present

## 2019-11-28 DIAGNOSIS — D631 Anemia in chronic kidney disease: Secondary | ICD-10-CM | POA: Diagnosis not present

## 2019-11-30 DIAGNOSIS — Z86711 Personal history of pulmonary embolism: Secondary | ICD-10-CM | POA: Diagnosis not present

## 2019-11-30 DIAGNOSIS — Z86718 Personal history of other venous thrombosis and embolism: Secondary | ICD-10-CM | POA: Diagnosis not present

## 2019-11-30 DIAGNOSIS — Z8249 Family history of ischemic heart disease and other diseases of the circulatory system: Secondary | ICD-10-CM | POA: Diagnosis not present

## 2019-11-30 DIAGNOSIS — I824Z1 Acute embolism and thrombosis of unspecified deep veins of right distal lower extremity: Secondary | ICD-10-CM | POA: Diagnosis not present

## 2019-12-06 DIAGNOSIS — N201 Calculus of ureter: Secondary | ICD-10-CM | POA: Diagnosis not present

## 2019-12-06 DIAGNOSIS — Z87442 Personal history of urinary calculi: Secondary | ICD-10-CM | POA: Diagnosis not present

## 2019-12-06 DIAGNOSIS — R1032 Left lower quadrant pain: Secondary | ICD-10-CM | POA: Diagnosis not present

## 2019-12-06 DIAGNOSIS — N200 Calculus of kidney: Secondary | ICD-10-CM | POA: Diagnosis not present

## 2019-12-14 ENCOUNTER — Other Ambulatory Visit: Payer: Self-pay | Admitting: Nephrology

## 2019-12-14 DIAGNOSIS — N179 Acute kidney failure, unspecified: Secondary | ICD-10-CM

## 2019-12-15 ENCOUNTER — Ambulatory Visit
Admission: RE | Admit: 2019-12-15 | Discharge: 2019-12-15 | Disposition: A | Discharge status: Home / Self Care | Payer: Medicare Other | Source: Ambulatory Visit | Attending: Nephrology | Admitting: Nephrology

## 2019-12-15 DIAGNOSIS — E039 Hypothyroidism, unspecified: Secondary | ICD-10-CM | POA: Diagnosis not present

## 2019-12-15 DIAGNOSIS — N179 Acute kidney failure, unspecified: Secondary | ICD-10-CM | POA: Diagnosis not present

## 2019-12-15 DIAGNOSIS — N2581 Secondary hyperparathyroidism of renal origin: Secondary | ICD-10-CM | POA: Diagnosis not present

## 2019-12-15 DIAGNOSIS — N1831 Chronic kidney disease, stage 3a: Secondary | ICD-10-CM | POA: Diagnosis not present

## 2019-12-15 DIAGNOSIS — N189 Chronic kidney disease, unspecified: Secondary | ICD-10-CM | POA: Diagnosis not present

## 2019-12-15 DIAGNOSIS — R7303 Prediabetes: Secondary | ICD-10-CM | POA: Diagnosis not present

## 2019-12-15 DIAGNOSIS — E785 Hyperlipidemia, unspecified: Secondary | ICD-10-CM | POA: Diagnosis not present

## 2019-12-15 DIAGNOSIS — I129 Hypertensive chronic kidney disease with stage 1 through stage 4 chronic kidney disease, or unspecified chronic kidney disease: Secondary | ICD-10-CM | POA: Diagnosis not present

## 2019-12-15 DIAGNOSIS — I2699 Other pulmonary embolism without acute cor pulmonale: Secondary | ICD-10-CM | POA: Diagnosis not present

## 2020-01-24 DIAGNOSIS — D225 Melanocytic nevi of trunk: Secondary | ICD-10-CM | POA: Diagnosis not present

## 2020-01-24 DIAGNOSIS — L821 Other seborrheic keratosis: Secondary | ICD-10-CM | POA: Diagnosis not present

## 2020-01-24 DIAGNOSIS — B078 Other viral warts: Secondary | ICD-10-CM | POA: Diagnosis not present

## 2020-01-24 DIAGNOSIS — Z85828 Personal history of other malignant neoplasm of skin: Secondary | ICD-10-CM | POA: Diagnosis not present

## 2020-01-24 DIAGNOSIS — L905 Scar conditions and fibrosis of skin: Secondary | ICD-10-CM | POA: Diagnosis not present

## 2020-01-24 DIAGNOSIS — L731 Pseudofolliculitis barbae: Secondary | ICD-10-CM | POA: Diagnosis not present

## 2020-01-30 DIAGNOSIS — N189 Chronic kidney disease, unspecified: Secondary | ICD-10-CM | POA: Diagnosis not present

## 2020-01-30 DIAGNOSIS — E785 Hyperlipidemia, unspecified: Secondary | ICD-10-CM | POA: Diagnosis not present

## 2020-01-30 DIAGNOSIS — N1831 Chronic kidney disease, stage 3a: Secondary | ICD-10-CM | POA: Diagnosis not present

## 2020-01-30 DIAGNOSIS — R7303 Prediabetes: Secondary | ICD-10-CM | POA: Diagnosis not present

## 2020-01-30 DIAGNOSIS — N2581 Secondary hyperparathyroidism of renal origin: Secondary | ICD-10-CM | POA: Diagnosis not present

## 2020-01-30 DIAGNOSIS — I129 Hypertensive chronic kidney disease with stage 1 through stage 4 chronic kidney disease, or unspecified chronic kidney disease: Secondary | ICD-10-CM | POA: Diagnosis not present

## 2020-01-30 DIAGNOSIS — N179 Acute kidney failure, unspecified: Secondary | ICD-10-CM | POA: Diagnosis not present

## 2020-01-30 DIAGNOSIS — E039 Hypothyroidism, unspecified: Secondary | ICD-10-CM | POA: Diagnosis not present

## 2020-01-30 DIAGNOSIS — I2699 Other pulmonary embolism without acute cor pulmonale: Secondary | ICD-10-CM | POA: Diagnosis not present

## 2020-02-16 DIAGNOSIS — Z86718 Personal history of other venous thrombosis and embolism: Secondary | ICD-10-CM | POA: Diagnosis not present

## 2020-02-16 DIAGNOSIS — Z86711 Personal history of pulmonary embolism: Secondary | ICD-10-CM | POA: Diagnosis not present

## 2020-02-16 DIAGNOSIS — N184 Chronic kidney disease, stage 4 (severe): Secondary | ICD-10-CM | POA: Diagnosis not present

## 2020-02-16 DIAGNOSIS — E785 Hyperlipidemia, unspecified: Secondary | ICD-10-CM | POA: Diagnosis not present

## 2020-02-16 DIAGNOSIS — E039 Hypothyroidism, unspecified: Secondary | ICD-10-CM | POA: Diagnosis not present

## 2020-02-16 DIAGNOSIS — I824Z1 Acute embolism and thrombosis of unspecified deep veins of right distal lower extremity: Secondary | ICD-10-CM | POA: Diagnosis not present

## 2020-02-16 DIAGNOSIS — E1169 Type 2 diabetes mellitus with other specified complication: Secondary | ICD-10-CM | POA: Diagnosis not present

## 2020-04-02 ENCOUNTER — Other Ambulatory Visit: Payer: Self-pay

## 2020-04-03 ENCOUNTER — Other Ambulatory Visit: Payer: Self-pay

## 2020-04-03 ENCOUNTER — Ambulatory Visit (INDEPENDENT_AMBULATORY_CARE_PROVIDER_SITE_OTHER): Payer: Medicare Other | Admitting: Cardiology

## 2020-04-03 ENCOUNTER — Encounter: Payer: Self-pay | Admitting: Cardiology

## 2020-04-03 VITALS — BP 132/80 | HR 83 | Ht 68.0 in | Wt 184.6 lb

## 2020-04-03 DIAGNOSIS — I82401 Acute embolism and thrombosis of unspecified deep veins of right lower extremity: Secondary | ICD-10-CM

## 2020-04-03 DIAGNOSIS — I82409 Acute embolism and thrombosis of unspecified deep veins of unspecified lower extremity: Secondary | ICD-10-CM | POA: Insufficient documentation

## 2020-04-03 DIAGNOSIS — E088 Diabetes mellitus due to underlying condition with unspecified complications: Secondary | ICD-10-CM | POA: Diagnosis not present

## 2020-04-03 DIAGNOSIS — Z86711 Personal history of pulmonary embolism: Secondary | ICD-10-CM

## 2020-04-03 DIAGNOSIS — E782 Mixed hyperlipidemia: Secondary | ICD-10-CM | POA: Diagnosis not present

## 2020-04-03 HISTORY — DX: Acute embolism and thrombosis of unspecified deep veins of unspecified lower extremity: I82.409

## 2020-04-03 NOTE — Patient Instructions (Signed)

## 2020-04-03 NOTE — Progress Notes (Signed)
Cardiology Office Note:    Date:  04/03/2020   ID:  Brandon Norman, DOB May 12, 1956, MRN [id number]  PCP:  Nicoletta Dress, MD  Cardiologist:  Jenean Lindau, MD   Referring MD: Nicoletta Dress, MD    ASSESSMENT:    1. Mixed hyperlipidemia   2. Diabetes mellitus due to underlying condition with unspecified complications ([place])   3. History of pulmonary embolism   4. Acute deep vein thrombosis (DVT) of right lower extremity, unspecified vein ([facility])    PLAN:    In order of problems listed above:  1. I discussed my findings with the patient in extensive length. 2. Primary prevention stressed with the patient.  Importance of compliance with diet medication stressed any vocalized understanding. 3. Mixed dyslipidemia: Diet was emphasized and he follows his diet well.  Lab work was reviewed from [facility] in the past and that is fine. 4. History of recurrent DVT: I educated him about Xarelto whenever going off anticoagulants.  I told him in the future if he needs to be off any of these medications that he must get bridging with heparin and he understands and questions were answered to his satisfaction. 5. Patient will be seen in follow-up appointment in 6 months or earlier if the patient has any concerns    Medication Adjustments/Labs and Tests Ordered: Current medicines are reviewed at length with the patient today.  Concerns regarding medicines are outlined above.  No orders of the defined types were placed in this encounter.  No orders of the defined types were placed in this encounter.    No chief complaint on file.    History of Present Illness:    Brandon Norman is a 64 y.o. male.  Patient has past medical history of pulmonary embolism, mixed dyslipidemia and diabetes mellitus.  He mentions to me that recently his Xarelto was interrupted for kidney stone treatment and he developed DVT.  Subsequently has been on Xarelto without any problems.  No chest pain orthopnea  or PND.  No dyspnea on exertion.  At the time of my evaluation, the patient is alert awake oriented and in no distress.  Past Medical History:  Diagnosis Date  . Diabetes mellitus due to underlying condition with unspecified complications ([place]) 6/64/4034  . History of pulmonary embolism 02/03/2019  . Hyperlipidemia 04/07/2017  . Pulmonary embolism ([place]) 2015    Past Surgical History:  Procedure Laterality Date  . NO PAST SURGERIES      Current Medications: Current Meds  Medication Sig  . clindamycin (CLEOCIN T) 1 % lotion Apply 1 application topically as needed.  . fenofibrate 160 MG tablet Take 160 mg by mouth daily.  Marland Kitchen glimepiride (AMARYL) 2 MG tablet Take 1 tablet by mouth daily.  Marland Kitchen levothyroxine (SYNTHROID) 75 MCG tablet Take 75 mcg by mouth daily.  . rivaroxaban (XARELTO) 20 MG TABS tablet Take 20 mg by mouth daily.  . rosuvastatin (CRESTOR) 10 MG tablet Take 10 mg by mouth at bedtime.     Allergies:   Patient has no known allergies.   Social History   Socioeconomic History  . Marital status: Married    Spouse name: Not on file  . Number of children: Not on file  . Years of education: Not on file  . Highest education level: Not on file  Occupational History  . Not on file  Tobacco Use  . Smoking status: Never Smoker  . Smokeless tobacco: Never Used  Vaping Use  .  Vaping Use: Never used  Substance and Sexual Activity  . Alcohol use: Yes    Comment: occ  . Drug use: No  . Sexual activity: Not on file  Other Topics Concern  . Not on file  Social History Narrative  . Not on file   Social Determinants of Health   Financial Resource Strain:   . Difficulty of Paying Living Expenses: Not on file  Food Insecurity:   . Worried About Charity fundraiser in the Last Year: Not on file  . Ran Out of Food in the Last Year: Not on file  Transportation Needs:   . Lack of Transportation (Medical): Not on file  . Lack of Transportation (Non-Medical): Not on file   Physical Activity:   . Days of Exercise per Week: Not on file  . Minutes of Exercise per Session: Not on file  Stress:   . Feeling of Stress : Not on file  Social Connections:   . Frequency of Communication with Friends and Family: Not on file  . Frequency of Social Gatherings with Friends and Family: Not on file  . Attends Religious Services: Not on file  . Active Member of Clubs or Organizations: Not on file  . Attends Archivist Meetings: Not on file  . Marital Status: Not on file     Family History: The patient's family history includes Heart attack in his mother.  ROS:   Please see the history of present illness.    All other systems reviewed and are negative.  EKGs/Labs/Other Studies Reviewed:    The following studies were reviewed today: EKG reveals sinus rhythm and nonspecific ST-T changes   Recent Labs: No results found for requested labs within last 8760 hours.  Recent Lipid Panel No results found for: CHOL, TRIG, HDL, CHOLHDL, VLDL, LDLCALC, LDLDIRECT  Physical Exam:    VS:  BP 132/80   Pulse 83   Ht 5\' 8"  (1.727 m)   Wt 184 lb 9.6 oz (83.7 kg)   SpO2 97%   BMI 28.07 kg/m     Wt Readings from Last 3 Encounters:  04/03/20 184 lb 9.6 oz (83.7 kg)  02/03/19 179 lb (81.2 kg)  04/07/17 181 lb 0.6 oz (82.1 kg)     GEN: Patient is in no acute distress HEENT: Normal NECK: No JVD; No carotid bruits LYMPHATICS: No lymphadenopathy CARDIAC: Hear sounds regular, 2/6 systolic murmur at the apex. RESPIRATORY:  Clear to auscultation without rales, wheezing or rhonchi  ABDOMEN: Soft, non-tender, non-distended MUSCULOSKELETAL:  No edema; No deformity  SKIN: Warm and dry NEUROLOGIC:  Alert and oriented x 3 PSYCHIATRIC:  Normal affect   Signed, Jenean Lindau, MD  04/03/2020 11:19 AM    [place]

## 2020-04-17 DIAGNOSIS — Z23 Encounter for immunization: Secondary | ICD-10-CM | POA: Diagnosis not present

## 2020-05-18 DIAGNOSIS — E1169 Type 2 diabetes mellitus with other specified complication: Secondary | ICD-10-CM | POA: Diagnosis not present

## 2020-05-18 DIAGNOSIS — E785 Hyperlipidemia, unspecified: Secondary | ICD-10-CM | POA: Diagnosis not present

## 2020-05-18 DIAGNOSIS — I824Z1 Acute embolism and thrombosis of unspecified deep veins of right distal lower extremity: Secondary | ICD-10-CM | POA: Diagnosis not present

## 2020-05-18 DIAGNOSIS — E039 Hypothyroidism, unspecified: Secondary | ICD-10-CM | POA: Diagnosis not present

## 2020-05-18 DIAGNOSIS — Z125 Encounter for screening for malignant neoplasm of prostate: Secondary | ICD-10-CM | POA: Diagnosis not present

## 2020-05-18 DIAGNOSIS — Z86711 Personal history of pulmonary embolism: Secondary | ICD-10-CM | POA: Diagnosis not present

## 2020-05-18 DIAGNOSIS — Z23 Encounter for immunization: Secondary | ICD-10-CM | POA: Diagnosis not present

## 2020-05-18 DIAGNOSIS — Z86718 Personal history of other venous thrombosis and embolism: Secondary | ICD-10-CM | POA: Diagnosis not present

## 2020-05-18 DIAGNOSIS — Z6827 Body mass index (BMI) 27.0-27.9, adult: Secondary | ICD-10-CM | POA: Diagnosis not present

## 2020-05-18 DIAGNOSIS — N184 Chronic kidney disease, stage 4 (severe): Secondary | ICD-10-CM | POA: Diagnosis not present

## 2020-08-20 DIAGNOSIS — Z86010 Personal history of colonic polyps: Secondary | ICD-10-CM | POA: Diagnosis not present

## 2020-08-20 DIAGNOSIS — E1169 Type 2 diabetes mellitus with other specified complication: Secondary | ICD-10-CM | POA: Diagnosis not present

## 2020-08-20 DIAGNOSIS — Z1331 Encounter for screening for depression: Secondary | ICD-10-CM | POA: Diagnosis not present

## 2020-08-20 DIAGNOSIS — Z86718 Personal history of other venous thrombosis and embolism: Secondary | ICD-10-CM | POA: Diagnosis not present

## 2020-08-20 DIAGNOSIS — Z86711 Personal history of pulmonary embolism: Secondary | ICD-10-CM | POA: Diagnosis not present

## 2020-08-20 DIAGNOSIS — Z6828 Body mass index (BMI) 28.0-28.9, adult: Secondary | ICD-10-CM | POA: Diagnosis not present

## 2020-08-20 DIAGNOSIS — N183 Chronic kidney disease, stage 3 unspecified: Secondary | ICD-10-CM | POA: Diagnosis not present

## 2020-08-20 DIAGNOSIS — I825Z1 Chronic embolism and thrombosis of unspecified deep veins of right distal lower extremity: Secondary | ICD-10-CM | POA: Diagnosis not present

## 2020-08-20 DIAGNOSIS — E785 Hyperlipidemia, unspecified: Secondary | ICD-10-CM | POA: Diagnosis not present

## 2020-08-20 DIAGNOSIS — E039 Hypothyroidism, unspecified: Secondary | ICD-10-CM | POA: Diagnosis not present

## 2020-09-20 ENCOUNTER — Encounter: Payer: Self-pay | Admitting: Gastroenterology

## 2020-10-05 ENCOUNTER — Encounter: Payer: Self-pay | Admitting: Cardiology

## 2020-10-05 ENCOUNTER — Other Ambulatory Visit: Payer: Self-pay

## 2020-10-05 ENCOUNTER — Ambulatory Visit (INDEPENDENT_AMBULATORY_CARE_PROVIDER_SITE_OTHER): Payer: Medicare Other | Admitting: Cardiology

## 2020-10-05 VITALS — BP 126/60 | HR 76 | Ht 68.0 in | Wt 184.2 lb

## 2020-10-05 DIAGNOSIS — E782 Mixed hyperlipidemia: Secondary | ICD-10-CM | POA: Diagnosis not present

## 2020-10-05 DIAGNOSIS — E088 Diabetes mellitus due to underlying condition with unspecified complications: Secondary | ICD-10-CM

## 2020-10-05 DIAGNOSIS — Z86711 Personal history of pulmonary embolism: Secondary | ICD-10-CM

## 2020-10-05 MED ORDER — FISH OIL 1000 MG PO CAPS: 2.0000 | ORAL_CAPSULE | Freq: Two times a day (BID) | ORAL | 12 refills | Status: AC

## 2020-10-05 NOTE — Progress Notes (Signed)
Cardiology Office Note:    Date:  10/05/2020   ID:  Brandon Norman, DOB 1956/04/17, MRN [id number]  PCP:  Nicoletta Dress, MD  Cardiologist:  Jenean Lindau, MD   Referring MD: Nicoletta Dress, MD    ASSESSMENT:    1. History of pulmonary embolism   2. Diabetes mellitus due to underlying condition with unspecified complications ([place])   3. Mixed hyperlipidemia    PLAN:    In order of problems listed above:  1. I discussed my findings with the patient at length and primary prevention stressed 2. History of pulmonary embolism: I discussed this with the patient at extensive length.  I told him that anytime he needs a procedure he needs bridging and the benefits risks of the procedure have to be weighed in the context of recent DVT in the light of very brief spell work.  Off and on anticoagulation.  He understands.  He will also talk about this to his gastroenterologist when he is contemplating colonoscopy.. 3. Mixed dyslipidemia: Diet was emphasized and he understands it.  Lipids were reviewed. 4. Diabetes mellitus: Elevated hemoglobin A1c and diet was emphasized and he promises to do better. 5. Patient will be seen in follow-up appointment in 6 months or earlier if the patient has any concerns    Medication Adjustments/Labs and Tests Ordered: Current medicines are reviewed at length with the patient today.  Concerns regarding medicines are outlined above.  No orders of the defined types were placed in this encounter.  Meds ordered this encounter  Medications  . Omega-3 Fatty Acids (FISH OIL) 1000 MG CAPS    Sig: Take 2 capsules (2,000 mg total) by mouth 2 (two) times daily.    Dispense:  180 capsule    Refill:  12     No chief complaint on file.    History of Present Illness:    JOHNTA Norman is a 65 y.o. male.  Patient has past medical history of pulmonary embolism, mixed dyslipidemia and diabetes mellitus.  He stopped his anticoagulation for about a day or 2  and had DVT and subsequently this was reinstituted.  He denies any problems at this time and takes care of activities of daily living.  No chest pain orthopnea or PND.  At the time of my evaluation, the patient is alert awake oriented and in no distress.  Past Medical History:  Diagnosis Date  . Acute DVT (deep venous thrombosis) ([place]) 04/03/2020  . Diabetes mellitus due to underlying condition with unspecified complications ([place]) 2/67/1245  . History of pulmonary embolism 02/03/2019  . Hyperlipidemia 04/07/2017  . Pulmonary embolism ([place]) 2015    Past Surgical History:  Procedure Laterality Date  . NO PAST SURGERIES      Current Medications: Current Meds  Medication Sig  . fenofibrate 160 MG tablet Take 160 mg by mouth daily.  Marland Kitchen glimepiride (AMARYL) 2 MG tablet Take 1 tablet by mouth daily.  Marland Kitchen levothyroxine (SYNTHROID) 75 MCG tablet Take 75 mcg by mouth daily.  . Omega-3 Fatty Acids (FISH OIL) 1000 MG CAPS Take 2 capsules (2,000 mg total) by mouth 2 (two) times daily.  . rivaroxaban (XARELTO) 20 MG TABS tablet Take 20 mg by mouth daily.  . rosuvastatin (CRESTOR) 10 MG tablet Take 10 mg by mouth at bedtime.  . TRULICITY 8.09 XI/3.3AS SOPN Inject 0.5 mLs into the skin once a week.     Allergies:   Patient has no known allergies.   Social History  Socioeconomic History  . Marital status: Married    Spouse name: Not on file  . Number of children: Not on file  . Years of education: Not on file  . Highest education level: Not on file  Occupational History  . Not on file  Tobacco Use  . Smoking status: Never Smoker  . Smokeless tobacco: Never Used  Vaping Use  . Vaping Use: Never used  Substance and Sexual Activity  . Alcohol use: Yes    Comment: occ  . Drug use: No  . Sexual activity: Not on file  Other Topics Concern  . Not on file  Social History Narrative  . Not on file   Social Determinants of Health   Financial Resource Strain: Not on file  Food Insecurity: Not  on file  Transportation Needs: Not on file  Physical Activity: Not on file  Stress: Not on file  Social Connections: Not on file     Family History: The patient's family history includes Heart attack in his mother.  ROS:   Please see the history of present illness.    All other systems reviewed and are negative.  EKGs/Labs/Other Studies Reviewed:    The following studies were reviewed today: IMPRESSIONS    1. Left ventricular ejection fraction, by visual estimation, is 60 to  65%. The left ventricle has normal function. Normal left ventricular size.  There is no left ventricular hypertrophy.  2. Global right ventricle has normal systolic function.The right  ventricular size is normal. No increase in right ventricular wall  thickness.  3. Left atrial size was normal.  4. Right atrial size was normal.  5. The mitral valve is normal in structure. No evidence of mitral valve  regurgitation. No evidence of mitral stenosis.  6. The tricuspid valve is normal in structure. Tricuspid valve  regurgitation was not visualized by color flow Doppler.  7. The aortic valve is normal in structure. Aortic valve regurgitation  was not visualized by color flow Doppler. Structurally normal aortic  valve, with no evidence of sclerosis or stenosis.  8. The pulmonic valve was normal in structure. Pulmonic valve  regurgitation is not visualized by color flow Doppler.  9. The inferior vena cava is normal in size with greater than 50%  respiratory variability, suggesting right atrial pressure of 3 mmHg.    Recent Labs: No results found for requested labs within last 8760 hours.  Recent Lipid Panel No results found for: CHOL, TRIG, HDL, CHOLHDL, VLDL, LDLCALC, LDLDIRECT  Physical Exam:    VS:  BP 126/60   Pulse 76   Ht 5\' 8"  (1.727 m)   Wt 184 lb 3.2 oz (83.6 kg)   SpO2 97%   BMI 28.01 kg/m     Wt Readings from Last 3 Encounters:  10/05/20 184 lb 3.2 oz (83.6 kg)  04/03/20 184  lb 9.6 oz (83.7 kg)  02/03/19 179 lb (81.2 kg)     GEN: Patient is in no acute distress HEENT: Normal NECK: No JVD; No carotid bruits LYMPHATICS: No lymphadenopathy CARDIAC: Hear sounds regular, 2/6 systolic murmur at the apex. RESPIRATORY:  Clear to auscultation without rales, wheezing or rhonchi  ABDOMEN: Soft, non-tender, non-distended MUSCULOSKELETAL:  No edema; No deformity  SKIN: Warm and dry NEUROLOGIC:  Alert and oriented x 3 PSYCHIATRIC:  Normal affect   Signed, Jenean Lindau, MD  10/05/2020 1:13 PM    [facility]

## 2020-10-05 NOTE — Patient Instructions (Addendum)
Medication Instructions:  Your physician has recommended you make the following change in your medication:   Take 2 gms (over the counter) fish oil twice daily. You will take 2 tablets of 1000 mg fish oil twice daily for a total of 4000 mg daily. Make sure that you get 1000 mg tablets or you will need to adjust the number you take to make 2000 mg (2 gms) two times a day.  *If you need a refill on your cardiac medications before your next appointment, please call your pharmacy*   Lab Work: None ordered If you have labs (blood work) drawn today and your tests are completely normal, you will receive your results only by: Marland Kitchen MyChart Message (if you have MyChart) OR . A paper copy in the mail If you have any lab test that is abnormal or we need to change your treatment, we will call you to review the results.   Testing/Procedures: None ordered   Follow-Up: At [facility], you and your health needs are our priority.  As part of our continuing mission to provide you with exceptional heart care, we have created designated Provider Care Teams.  These Care Teams include your primary Cardiologist (physician) and Advanced Practice Providers (APPs -  Physician Assistants and Nurse Practitioners) who all work together to provide you with the care you need, when you need it.  We recommend signing up for the patient portal called "MyChart".  Sign up information is provided on this After Visit Summary.  MyChart is used to connect with patients for Virtual Visits (Telemedicine).  Patients are able to view lab/test results, encounter notes, upcoming appointments, etc.  Non-urgent messages can be sent to your provider as well.   To learn more about what you can do with MyChart, go to [email or web address].    Your next appointment:   6 month(s)  The format for your next appointment:   In Person  Provider:   Jyl Heinz, MD   Other Instructions Fish Oil, Omega-3 Fatty Acids capsules  (OTC) What is this medicine? FISH OIL, OMEGA-3 FATTY ACIDS (Fish Oil, oh MAY ga - 3 fatty AS ids) are essential fats. It is promoted to help support a healthy heart. This dietary supplement is used to add to a healthy diet. The FDA has not approved this supplement for any medical use. This supplement may be used for other purposes; ask your health care provider or pharmacist if you have questions. This medicine may be used for other purposes; ask your health care provider or pharmacist if you have questions. COMMON BRAND NAME(S): Omega-3, Omega-3 Fish Oil, OMEGA-3 IQ DHA, TherOmega, THEROMEGA SPORT What should I tell my health care provider before I take this medicine? They need to know if you have any of these conditions  bleeding problems  lung or breathing disease, like asthma  an unusual or allergic reaction to fish oil, omega-3 fatty acids, fish, other medicines, foods, dyes, or preservatives  pregnant or trying to get pregnant  breast-feeding How should I use this medicine? Take this medicine by mouth with a glass of water. Follow the directions on the package or prescription label. Take with food. Take your medicine at regular intervals. Do not take your medicine more often than directed. Talk to your pediatrician regarding the use of this medicine in children. Special care may be needed. This medicine should not be used in children without a doctor's advice. Overdosage: If you think you have taken too much of this medicine contact a  poison control center or emergency room at once. NOTE: This medicine is only for you. Do not share this medicine with others. What if I miss a dose? If you miss a dose, take it as soon as you can. If it is almost time for your next dose, take only that dose. Do not take double or extra doses. What may interact with this medicine?  aspirin and aspirin-like medicines  herbal products like danshen, dong quai, garlic pills, ginger, ginkgo biloba, horse  chestnut, willow bark, and others  medicines that treat or prevent blood clots like enoxaparin, heparin, warfarin This list may not describe all possible interactions. Give your health care provider a list of all the medicines, herbs, non-prescription drugs, or dietary supplements you use. Also tell them if you smoke, drink alcohol, or use illegal drugs. Some items may interact with your medicine. What should I watch for while using this medicine? Follow a good diet and exercise plan. Taking a dietary supplement does not replace a healthy lifestyle. Some foods that have omega-3 fatty acids naturally are fatty fish like albacore tuna, halibut, herring, mackerel, lake trout, salmon, and sardines. Too much of this supplement can be unsafe. Talk to your doctor or health care provider about how much of this supplement is right for you. If you are scheduled for any medical or dental procedure, tell your healthcare provider that you are taking this medicine. You may need to stop taking this medicine before the procedure. Herbal or dietary supplements are not regulated like medicines. Rigid quality control standards are not required for dietary supplements. The purity and strength of these products can vary. The safety and effect of this dietary supplement for a certain disease or illness is not well known. This product is not intended to diagnose, treat, cure or prevent any disease. The Food and Drug Administration suggests the following to help consumers protect themselves:  Always read product labels and follow directions.  Natural does not mean a product is safe for humans to take.  Look for products that include USP after the ingredient name. This means that the manufacturer followed the standards of the [place] Pharmacopoeia.  Supplements made or sold by a nationally known food or drug company are more likely to be made under tight controls. You can write to the company for more information about how the  product was made. What side effects may I notice from receiving this medicine? Side effects that you should report to your doctor or health care professional as soon as possible:  allergic reactions like skin rash, itching or hives, swelling of the face, lips, or tongue  breathing problems  changes in your moods or emotions  unusual bleeding or bruising Side effects that usually do not require medical attention (report to your doctor or health care professional if they continue or are bothersome):  bad or fishy breath  belching  diarrhea  nausea  stomach gas, upset  weight gain This list may not describe all possible side effects. Call your doctor for medical advice about side effects. You may report side effects to FDA at 1-800-FDA-1088. Where should I keep my medicine? Keep out of the reach of children. Store at room temperature or as directed on the package label. Protect from moisture. Do not freeze. Throw away any unused medicine after the expiration date. NOTE: This sheet is a summary. It may not cover all possible information. If you have questions about this medicine, talk to your doctor, pharmacist, or health care  provider.  2021 Elsevier/Gold Standard (2014-10-19 09:36:32)

## 2020-10-15 DIAGNOSIS — Z9181 History of falling: Secondary | ICD-10-CM | POA: Diagnosis not present

## 2020-10-15 DIAGNOSIS — Z1331 Encounter for screening for depression: Secondary | ICD-10-CM | POA: Diagnosis not present

## 2020-10-15 DIAGNOSIS — Z0000 Encounter for general adult medical examination without abnormal findings: Secondary | ICD-10-CM | POA: Diagnosis not present

## 2020-10-15 DIAGNOSIS — E785 Hyperlipidemia, unspecified: Secondary | ICD-10-CM | POA: Diagnosis not present

## 2020-10-24 ENCOUNTER — Ambulatory Visit (INDEPENDENT_AMBULATORY_CARE_PROVIDER_SITE_OTHER): Payer: Medicare Other | Admitting: Gastroenterology

## 2020-10-24 ENCOUNTER — Encounter: Payer: Self-pay | Admitting: Gastroenterology

## 2020-10-24 ENCOUNTER — Telehealth: Payer: Self-pay

## 2020-10-24 VITALS — BP 110/80 | HR 80 | Ht 68.0 in | Wt 183.2 lb

## 2020-10-24 DIAGNOSIS — R69 Illness, unspecified: Secondary | ICD-10-CM | POA: Diagnosis not present

## 2020-10-24 DIAGNOSIS — Z86010 Personal history of colonic polyps: Secondary | ICD-10-CM | POA: Diagnosis not present

## 2020-10-24 NOTE — Telephone Encounter (Signed)
[facility] Pre-operative Risk Assessment     Request for surgical clearance:     Endoscopy Procedure  What type of surgery is being performed?     Colonoscopy  When is this surgery scheduled?     12-12-2020  What type of clearance is required ?   Pharmacy  Are there any medications that need to be held prior to surgery and how long? Xarelto how many days are you allowing to hold and does patient need Lovenox Bridge?  Practice name and name of physician performing surgery?      [facility]  What is your office phone and fax number?      Phone- [phone]  [phone]  Anesthesia type (None, local, MAC, general) ?       MAC

## 2020-10-24 NOTE — Patient Instructions (Addendum)
If you are age 67 or older, your body mass index should be between 23-30. Your Body mass index is 27.86 kg/m. If this is out of the aforementioned range listed, please consider follow up with your Primary Care Provider.  If you are age 68 or younger, your body mass index should be between 19-25. Your Body mass index is 27.86 kg/m. If this is out of the aformentioned range listed, please consider follow up with your Primary Care Provider.   You have been scheduled for a colonoscopy. Please follow written instructions given to you at your visit today.  Please pick up your prep supplies at the pharmacy within the next 1-3 days. If you use inhalers (even only as needed), please bring them with you on the day of your procedure.  Please call with any questions or concerns   You will be contacted by our office prior to your procedure for directions on holding your Xarelto.  If you do not hear from our office 1 week prior to your scheduled procedure, please call [phone] to discuss.   Thank you,  Dr. Jackquline Denmark

## 2020-10-24 NOTE — Progress Notes (Signed)
Chief Complaint: for colon  Referring Provider:  Nicoletta Dress, MD      ASSESSMENT AND PLAN;   #1. H/O colonic polyps  #2.  Multiple comorbid conditions including DM2, CKD2, HLD, H/O DVT 03/2020, H/O PE 2015 on Xeralto  Plan: -Colonoscopy after cardiology clearance to hold Xarelto 2 days before and ?lovenox bridging per cardio (Dr Geraldo Pitter).     Discussed risks & benefits. Risks including rare perforation req laparotomy, bleeding after bx/polypectomy req blood transfusion, rarely missing neoplasms, risks of anesthesia/sedation. Benefits outweigh the risks. Patient agrees to proceed. All the questions were answered. Consent forms given for review.    HPI:    Brandon Norman is a 65 y.o. male  With DM2, CKD2 (GFR 46 ml/min), HLD, H/O DVT 03/2020, H/O PE 2015 on [place] husband  H/O polyps, due for rpt colon.  No nausea, vomiting, heartburn, regurgitation, odynophagia or dysphagia.  No significant diarrhea or constipation. No melena or hematochezia. No unintentional weight loss. No abdominal pain.  S/P lithotripsy for renal stones, after vaccine, had dvt 03/2020   Previous GI work-up Colonoscopy 11/2015: Colonic polyps s/p polypectomy, mild sigmoid diverticulosis. Bx- TAs. Rpt in 5 yrs. Past Medical History:  Diagnosis Date  . Acute DVT (deep venous thrombosis) ([place]) 04/03/2020  . Diabetes mellitus due to underlying condition with unspecified complications ([place]) 8/67/6720  . History of pulmonary embolism 02/03/2019  . Hyperlipidemia 04/07/2017  . Pulmonary embolism ([place]) 2015    Past Surgical History:  Procedure Laterality Date  . COLONOSCOPY    . NO PAST SURGERIES    . WISDOM TOOTH EXTRACTION  1992    Family History  Problem Relation Age of Onset  . Heart attack Mother   . Heart disease Mother   . Thyroid disease Sister   . Thyroid disease Sister     Social History   Tobacco Use  . Smoking status: Never Smoker  . Smokeless tobacco: Never  Used  Vaping Use  . Vaping Use: Never used  Substance Use Topics  . Alcohol use: Yes    Comment: occ  . Drug use: No    Current Outpatient Medications  Medication Sig Dispense Refill  . fenofibrate 160 MG tablet Take 160 mg by mouth daily.    Marland Kitchen glimepiride (AMARYL) 2 MG tablet Take 1 tablet by mouth daily.    Marland Kitchen levothyroxine (SYNTHROID) 75 MCG tablet Take 75 mcg by mouth daily.    . Omega-3 Fatty Acids (FISH OIL) 1000 MG CAPS Take 2 capsules (2,000 mg total) by mouth 2 (two) times daily. 180 capsule 12  . rivaroxaban (XARELTO) 20 MG TABS tablet Take 20 mg by mouth daily.    . rosuvastatin (CRESTOR) 10 MG tablet Take 10 mg by mouth at bedtime.    . TRULICITY 9.47 SJ/6.2EZ SOPN Inject 0.5 mLs into the skin once a week.     No current facility-administered medications for this visit.    No Known Allergies  Review of Systems:  Constitutional: Denies fever, chills, diaphoresis, appetite change and fatigue.  HEENT: Denies photophobia, eye pain, redness, hearing loss, ear pain, congestion, sore throat, rhinorrhea, sneezing, mouth sores, neck pain, neck stiffness and tinnitus.   Respiratory: Denies SOB, DOE, cough, chest tightness,  and wheezing.   Cardiovascular: Denies chest pain, palpitations and leg swelling.  Genitourinary: Denies dysuria, urgency, frequency, hematuria, flank pain and difficulty urinating.  Musculoskeletal: Denies myalgias, back pain, joint swelling, arthralgias and gait problem.  Skin: No rash.  Neurological:  Denies dizziness, seizures, syncope, weakness, light-headedness, numbness and headaches.  Hematological: Denies adenopathy. Easy bruising, personal or family bleeding history  Psychiatric/Behavioral: No anxiety or depression     Physical Exam:    BP 110/80 (BP Location: Left Arm, Patient Position: Sitting, Cuff Size: Normal)   Pulse 80   Ht 5\' 8"  (1.727 m)   Wt 183 lb 4 oz (83.1 kg)   BMI 27.86 kg/m  Wt Readings from Last 3 Encounters:  10/24/20 183  lb 4 oz (83.1 kg)  10/05/20 184 lb 3.2 oz (83.6 kg)  04/03/20 184 lb 9.6 oz (83.7 kg)   Constitutional:  Well-developed, in no acute distress. Psychiatric: Normal mood and affect. Behavior is normal. HEENT: Pupils normal.  Conjunctivae are normal. No scleral icterus. Cardiovascular: Normal rate, regular rhythm. No edema Pulmonary/chest: Effort normal and breath sounds normal. No wheezing, rales or rhonchi. Abdominal: Soft, nondistended. Nontender. Bowel sounds active throughout. There are no masses palpable. No hepatomegaly. Rectal: Deferred Neurological: Alert and oriented to person place and time. Skin: Skin is warm and dry. No rashes noted.  Data Reviewed: I have personally reviewed following labs and imaging studies  CBC: CBC Latest Ref Rng & Units 10/30/2009  WBC 4.0 - 10.3 10e3/uL 6.2  Hemoglobin 13.0 - 17.1 g/dL 16.7  Hematocrit 38.4 - 49.9 % 48.0  Platelets 140 - 400 10e3/uL 254    CMP: CMP Latest Ref Rng & Units 10/30/2009  Glucose 70 - 99 mg/dL 125(H)  BUN 6 - 23 mg/dL 23  Creatinine 0.40 - 1.50 mg/dL 1.52(H)  Sodium 135 - 145 mEq/L 138  Potassium 3.5 - 5.3 mEq/L 4.5  Chloride 96 - 112 mEq/L 104  CO2 19 - 32 mEq/L 21  Calcium 8.4 - 10.5 mg/dL 9.0  Total Protein 6.0 - 8.3 g/dL 7.4  Total Bilirubin 0.3 - 1.2 mg/dL 0.6  Alkaline Phos 39 - 117 U/L 52  AST 0 - 37 U/L 19  ALT 0 - 53 U/L 24      Carmell Austria, MD 10/24/2020, 8:49 AM  Cc: Nicoletta Dress, MD

## 2020-10-25 NOTE — Telephone Encounter (Signed)
Please comment on Xarelto. He is on it for PE/DVT and not cardiac, but last note by Dr. Geraldo Pitter specifies he will need lovenox bridging for any procedure. Unsure if we manage or defer to Primary team. Thanks!

## 2020-10-25 NOTE — Telephone Encounter (Signed)
I agree with your approach and recommendation

## 2020-10-25 NOTE — Telephone Encounter (Signed)
Patient with diagnosis of VTE on Xarelto for anticoagulation.    Procedure: colonoscopy Date of procedure: 12/12/20   CrCl 54 Platelet count 269  Per chart patient has history of recurrent VTE, once occurring when off anticoagulation for just a few days.    Would recommend holding Xarelto for only 1 dose (evening prior to procedure) and restart on evening of procedure.  If GI feels that Xarelto cannot be given on day of procedure or for any days after that, would recommend bridging post procedure.    Will route to Dr. Geraldo Pitter to confirm that this would be acceptable.    (if patient takes Xarelto in the mornings, would suggest holding day prior to procedure and doing enoxaparin injection in place of that dose)

## 2020-10-26 ENCOUNTER — Encounter: Payer: Self-pay | Admitting: Pharmacist Clinician (PhC)/ Clinical Pharmacy Specialist

## 2020-10-26 MED ORDER — ENOXAPARIN SODIUM 120 MG/0.8ML ~~LOC~~ SOLN: 120.0000 mg | SUBCUTANEOUS | 0 refills | Status: DC

## 2020-10-26 NOTE — Telephone Encounter (Signed)
S/w pt and he confirms he takes Xarelto in the morning. Pt states also procedure date was moved out to 12/26/20 from 12/12/20. I thanked the pt for his help and will update the cardiologist and Pharm-D.

## 2020-10-26 NOTE — Telephone Encounter (Signed)
Brandon Norman,   Patient will need to lovenox bridge.  Please reach out to him and be sure he is comfortable with injection technique.  We will send a message thru MyChart with exact instructions - Hold Xarelto on June 14 (day before procedure) and instead do enoxaparin injection that morning.  Resume Xarelto day of procedure (when he gets home), unless polyps were removed, and then will need to resume then next morning as scheduled.

## 2020-10-26 NOTE — Telephone Encounter (Signed)
Called and spoke with pt regarding the need to hold xarelto prior to precedure and inject lovenox instead. Pt voiced understanding and will route back to kristin alvstad pharmd.

## 2020-10-26 NOTE — Telephone Encounter (Signed)
Patient tales Xarelto in the AM. He will need pharmacy visit for lovenox inj education prior to procedure.

## 2020-10-26 NOTE — Telephone Encounter (Signed)
Can we please call and confirm if he takes Xarelto in the am or pm? Thanks!

## 2020-10-31 NOTE — Telephone Encounter (Signed)
I reached out to Pharm-D Tommy Medal if any thing further was needed on pre op call back side for the pt. Per Erasmo Downer we spoke with him and sent a MyChart message with instructions as well.   I will remove from pre op call back pool.

## 2020-11-16 DIAGNOSIS — E1169 Type 2 diabetes mellitus with other specified complication: Secondary | ICD-10-CM | POA: Diagnosis not present

## 2020-11-16 DIAGNOSIS — E039 Hypothyroidism, unspecified: Secondary | ICD-10-CM | POA: Diagnosis not present

## 2020-11-16 DIAGNOSIS — Z86711 Personal history of pulmonary embolism: Secondary | ICD-10-CM | POA: Diagnosis not present

## 2020-11-16 DIAGNOSIS — Z6828 Body mass index (BMI) 28.0-28.9, adult: Secondary | ICD-10-CM | POA: Diagnosis not present

## 2020-11-16 DIAGNOSIS — N183 Chronic kidney disease, stage 3 unspecified: Secondary | ICD-10-CM | POA: Diagnosis not present

## 2020-11-16 DIAGNOSIS — E785 Hyperlipidemia, unspecified: Secondary | ICD-10-CM | POA: Diagnosis not present

## 2020-11-16 DIAGNOSIS — Z86718 Personal history of other venous thrombosis and embolism: Secondary | ICD-10-CM | POA: Diagnosis not present

## 2020-11-16 DIAGNOSIS — I825Z1 Chronic embolism and thrombosis of unspecified deep veins of right distal lower extremity: Secondary | ICD-10-CM | POA: Diagnosis not present

## 2020-12-11 ENCOUNTER — Telehealth: Payer: Self-pay | Admitting: Cardiology

## 2020-12-11 NOTE — Telephone Encounter (Signed)
   *  STAT* If patient is at the pharmacy, call can be transferred to refill team.   1. Which medications need to be refilled? (please list name of each medication and dose if known)   enoxaparin (LOVENOX) 120 MG/0.8ML injection    2. Which pharmacy/location (including street and city if local pharmacy) is medication to be sent to? [place], [place]  3. Do they need a 30 day or 90 day supply? 30 days   Pt said when he tried picking up this prescription, pharmacy said they didn't receive prescription. Please resend to pharmacy

## 2020-12-12 ENCOUNTER — Encounter: Payer: Medicare Other | Admitting: Gastroenterology

## 2020-12-13 MED ORDER — ENOXAPARIN SODIUM 120 MG/0.8ML IJ SOSY: PREFILLED_SYRINGE | INTRAMUSCULAR | 0 refills | Status: DC

## 2020-12-14 ENCOUNTER — Encounter: Payer: Medicare Other | Admitting: Gastroenterology

## 2020-12-26 ENCOUNTER — Encounter: Payer: Self-pay | Admitting: Gastroenterology

## 2020-12-26 ENCOUNTER — Other Ambulatory Visit: Payer: Self-pay

## 2020-12-26 ENCOUNTER — Ambulatory Visit (AMBULATORY_SURGERY_CENTER): Payer: Medicare Other | Admitting: Gastroenterology

## 2020-12-26 VITALS — BP 118/71 | HR 72 | Temp 97.8°F | Resp 12 | Ht 68.0 in | Wt 183.0 lb

## 2020-12-26 DIAGNOSIS — Z86010 Personal history of colonic polyps: Secondary | ICD-10-CM | POA: Diagnosis not present

## 2020-12-26 DIAGNOSIS — Z1211 Encounter for screening for malignant neoplasm of colon: Secondary | ICD-10-CM | POA: Diagnosis not present

## 2020-12-26 DIAGNOSIS — D120 Benign neoplasm of cecum: Secondary | ICD-10-CM

## 2020-12-26 DIAGNOSIS — D128 Benign neoplasm of rectum: Secondary | ICD-10-CM | POA: Diagnosis not present

## 2020-12-26 HISTORY — PX: COLONOSCOPY: SHX174

## 2020-12-26 MED ORDER — SODIUM CHLORIDE 0.9 % IV SOLN: 500.0000 mL | Freq: Once | INTRAVENOUS | Status: DC

## 2020-12-26 MED ORDER — ENOXAPARIN SODIUM 40 MG/0.4ML IJ SOSY: PREFILLED_SYRINGE | INTRAMUSCULAR | 0 refills | Status: DC

## 2020-12-26 NOTE — Patient Instructions (Signed)
YOU HAD AN ENDOSCOPIC PROCEDURE TODAY AT [facility]:   Refer to the procedure report that was given to you for any specific questions about what was found during the examination.  If the procedure report does not answer your questions, please call your gastroenterologist to clarify.  If you requested that your care partner not be given the details of your procedure findings, then the procedure report has been included in a sealed envelope for you to review at your convenience later.  **Handouts given on polyps, Diverticulosis and hemorrhoids**  YOU SHOULD EXPECT: Some feelings of bloating in the abdomen. Passage of more gas than usual.  Walking can help get rid of the air that was put into your GI tract during the procedure and reduce the bloating. If you had a lower endoscopy (such as a colonoscopy or flexible sigmoidoscopy) you may notice spotting of blood in your stool or on the toilet paper. If you underwent a bowel prep for your procedure, you may not have a normal bowel movement for a few days.  Please Note:  You might notice some irritation and congestion in your nose or some drainage.  This is from the oxygen used during your procedure.  There is no need for concern and it should clear up in a day or so.  SYMPTOMS TO REPORT IMMEDIATELY:  Following lower endoscopy (colonoscopy or flexible sigmoidoscopy):  Excessive amounts of blood in the stool  Significant tenderness or worsening of abdominal pains  Swelling of the abdomen that is new, acute  Fever of 100F or higher  For urgent or emergent issues, a gastroenterologist can be reached at any hour by calling [phone]. Do not use MyChart messaging for urgent concerns.    DIET:  We do recommend a small meal at first, but then you may proceed to your regular diet.  Drink plenty of fluids but you should avoid alcoholic beverages for 24 hours.  ACTIVITY:  You should plan to take it easy for the rest of today and you  should NOT DRIVE or use heavy machinery until tomorrow (because of the sedation medicines used during the test).    FOLLOW UP: Our staff will call the number listed on your records 48-72 hours following your procedure to check on you and address any questions or concerns that you may have regarding the information given to you following your procedure. If we do not reach you, we will leave a message.  We will attempt to reach you two times.  During this call, we will ask if you have developed any symptoms of COVID 19. If you develop any symptoms (ie: fever, flu-like symptoms, shortness of breath, cough etc.) before then, please call [phone].  If you test positive for Covid 19 in the 2 weeks post procedure, please call and report this information to [place].    If any biopsies were taken you will be contacted by phone or by letter within the next 1-3 weeks.  Please call us at [phone] if you have not heard about the biopsies in 3 weeks.    SIGNATURES/CONFIDENTIALITY: You and/or your care partner have signed paperwork which will be entered into your electronic medical record.  These signatures attest to the fact that that the information above on your After Visit Summary has been reviewed and is understood.  Full responsibility of the confidentiality of this discharge information lies with you and/or your care-partner.

## 2020-12-26 NOTE — Progress Notes (Signed)
PT taken to PACU. Monitors in place. VSS. Report given to RN. 

## 2020-12-26 NOTE — Progress Notes (Signed)
Called to room to assist during endoscopic procedure.  Patient ID and intended procedure confirmed with present staff. Received instructions for my participation in the procedure from the performing physician.  

## 2020-12-26 NOTE — Progress Notes (Signed)
VS taken by C.W. 

## 2020-12-26 NOTE — Op Note (Signed)
[facility] Patient Name: Karen Kinnard Procedure Date: 12/26/2020 11:56 AM MRN: [id number] Endoscopist: Jackquline Denmark , MD Age: 65 Referring MD:  Date of Birth: 1956/04/30 Gender: Male Account #: [id number] Procedure:                Colonoscopy Indications:              High risk colon cancer surveillance: Personal                            history of colonic polyps Medicines:                Monitored Anesthesia Care Procedure:                Pre-Anesthesia Assessment:                           - Prior to the procedure, a History and Physical                            was performed, and patient medications and                            allergies were reviewed. The patient's tolerance of                            previous anesthesia was also reviewed. The risks                            and benefits of the procedure and the sedation                            options and risks were discussed with the patient.                            All questions were answered, and informed consent                            was obtained. Prior Anticoagulants: The patient has                            taken Xarelto (rivaroxaban), last dose was 1 day                            prior to procedure. ASA Grade Assessment: II - A                            patient with mild systemic disease. After reviewing                            the risks and benefits, the patient was deemed in                            satisfactory condition to undergo the procedure.  After obtaining informed consent, the colonoscope                            was passed under direct vision. Throughout the                            procedure, the patient's blood pressure, pulse, and                            oxygen saturations were monitored continuously. The                            Olympus CF-HQ190 (#[id number]) Colonoscope was                            introduced through the anus and  advanced to the 2                            cm into the ileum. The colonoscopy was performed                            without difficulty. The patient tolerated the                            procedure well. The quality of the bowel                            preparation was good. The terminal ileum, ileocecal                            valve, appendiceal orifice, and rectum were                            photographed. Scope In: 70:96:28 PM Scope Out: 12:24:22 PM Scope Withdrawal Time: 0 hours 12 minutes 35 seconds  Total Procedure Duration: 0 hours 16 minutes 4 seconds  Findings:                 A 2 mm polyp was found in the cecum. The polyp was                            sessile. The polyp was removed with a cold snare.                            Resection and retrieval were complete.                           Two sessile polyps were found in the mid rectum.                            The polyps were 4 to 6 mm in size. These polyps                            were removed  with a cold snare. Resection and                            retrieval were complete.                           A few small-mouthed diverticula were found in the                            sigmoid colon.                           Non-bleeding internal hemorrhoids were found during                            retroflexion. The hemorrhoids were small.                           The terminal ileum appeared normal.                           The exam was otherwise without abnormality on                            direct and retroflexion views. Complications:            No immediate complications. Estimated Blood Loss:     Estimated blood loss: none. Impression:               - One 2 mm polyp in the cecum, removed with a cold                            snare. Resected and retrieved.                           - Two 4 to 6 mm polyps in the mid rectum, removed                            with a cold snare. Resected and  retrieved.                           - Minimal sigmoid diverticulosis.                           - The examined portion of the ileum was normal.                           - The examination was otherwise normal on direct                            and retroflexion views. Recommendation:           - Patient has a contact number available for                            emergencies. The signs and symptoms of potential  delayed complications were discussed with the                            patient. Return to normal activities tomorrow.                            Written discharge instructions were provided to the                            patient.                           - Resume previous diet.                           - Take Lovenox in 6 hours. Resume Xarelto                            (rivaroxaban) at prior dose tomorrow.                           - The findings and recommendations were discussed                            with [place]. Jackquline Denmark, MD 12/26/2020 12:29:26 PM This report has been signed electronically.

## 2020-12-28 ENCOUNTER — Telehealth: Payer: Self-pay | Admitting: *Deleted

## 2020-12-28 NOTE — Telephone Encounter (Signed)
  Follow up Call-  Call back number 12/26/2020  Post procedure Call Back phone  # [phone]  Permission to leave phone message Yes  Some recent data might be hidden     Patient questions:  Do you have a fever, pain , or abdominal swelling? No. Pain Score  0 *  Have you tolerated food without any problems? Yes.    Have you been able to return to your normal activities? Yes.    Do you have any questions about your discharge instructions: Diet   No. Medications  No. Follow up visit  No.  Do you have questions or concerns about your Care? No.  Actions: * If pain score is 4 or above: No action needed, pain <4.Have you developed a fever since your procedure? no  2.   Have you had an respiratory symptoms (SOB or cough) since your procedure? no  3.   Have you tested positive for COVID 19 since your procedure no  4.   Have you had any family members/close contacts diagnosed with the COVID 19 since your procedure?  no   If yes to any of these questions please route to Joylene John, RN and Joella Prince, RN

## 2021-01-07 ENCOUNTER — Encounter: Payer: Self-pay | Admitting: Gastroenterology

## 2021-01-23 DIAGNOSIS — Z85828 Personal history of other malignant neoplasm of skin: Secondary | ICD-10-CM | POA: Diagnosis not present

## 2021-01-23 DIAGNOSIS — D485 Neoplasm of uncertain behavior of skin: Secondary | ICD-10-CM | POA: Diagnosis not present

## 2021-01-23 DIAGNOSIS — L308 Other specified dermatitis: Secondary | ICD-10-CM | POA: Diagnosis not present

## 2021-01-23 DIAGNOSIS — L570 Actinic keratosis: Secondary | ICD-10-CM | POA: Diagnosis not present

## 2021-01-23 DIAGNOSIS — L905 Scar conditions and fibrosis of skin: Secondary | ICD-10-CM | POA: Diagnosis not present

## 2021-01-23 DIAGNOSIS — L728 Other follicular cysts of the skin and subcutaneous tissue: Secondary | ICD-10-CM | POA: Diagnosis not present

## 2021-02-08 DIAGNOSIS — L570 Actinic keratosis: Secondary | ICD-10-CM | POA: Diagnosis not present

## 2021-02-18 DIAGNOSIS — E785 Hyperlipidemia, unspecified: Secondary | ICD-10-CM | POA: Diagnosis not present

## 2021-02-18 DIAGNOSIS — Z86711 Personal history of pulmonary embolism: Secondary | ICD-10-CM | POA: Diagnosis not present

## 2021-02-18 DIAGNOSIS — Z6827 Body mass index (BMI) 27.0-27.9, adult: Secondary | ICD-10-CM | POA: Diagnosis not present

## 2021-02-18 DIAGNOSIS — E039 Hypothyroidism, unspecified: Secondary | ICD-10-CM | POA: Diagnosis not present

## 2021-02-18 DIAGNOSIS — I825Z1 Chronic embolism and thrombosis of unspecified deep veins of right distal lower extremity: Secondary | ICD-10-CM | POA: Diagnosis not present

## 2021-02-18 DIAGNOSIS — N183 Chronic kidney disease, stage 3 unspecified: Secondary | ICD-10-CM | POA: Diagnosis not present

## 2021-02-18 DIAGNOSIS — K219 Gastro-esophageal reflux disease without esophagitis: Secondary | ICD-10-CM | POA: Diagnosis not present

## 2021-02-18 DIAGNOSIS — Z86718 Personal history of other venous thrombosis and embolism: Secondary | ICD-10-CM | POA: Diagnosis not present

## 2021-02-18 DIAGNOSIS — E1169 Type 2 diabetes mellitus with other specified complication: Secondary | ICD-10-CM | POA: Diagnosis not present

## 2021-02-26 DIAGNOSIS — E113293 Type 2 diabetes mellitus with mild nonproliferative diabetic retinopathy without macular edema, bilateral: Secondary | ICD-10-CM | POA: Diagnosis not present

## 2021-02-26 DIAGNOSIS — H524 Presbyopia: Secondary | ICD-10-CM | POA: Diagnosis not present

## 2021-02-26 DIAGNOSIS — H5203 Hypermetropia, bilateral: Secondary | ICD-10-CM | POA: Diagnosis not present

## 2021-02-26 DIAGNOSIS — H2513 Age-related nuclear cataract, bilateral: Secondary | ICD-10-CM | POA: Diagnosis not present

## 2021-03-07 DIAGNOSIS — E785 Hyperlipidemia, unspecified: Secondary | ICD-10-CM | POA: Diagnosis not present

## 2021-03-07 DIAGNOSIS — I2699 Other pulmonary embolism without acute cor pulmonale: Secondary | ICD-10-CM | POA: Diagnosis not present

## 2021-03-07 DIAGNOSIS — N2581 Secondary hyperparathyroidism of renal origin: Secondary | ICD-10-CM | POA: Diagnosis not present

## 2021-03-07 DIAGNOSIS — E039 Hypothyroidism, unspecified: Secondary | ICD-10-CM | POA: Diagnosis not present

## 2021-03-07 DIAGNOSIS — N179 Acute kidney failure, unspecified: Secondary | ICD-10-CM | POA: Diagnosis not present

## 2021-03-07 DIAGNOSIS — R7303 Prediabetes: Secondary | ICD-10-CM | POA: Diagnosis not present

## 2021-03-07 DIAGNOSIS — I129 Hypertensive chronic kidney disease with stage 1 through stage 4 chronic kidney disease, or unspecified chronic kidney disease: Secondary | ICD-10-CM | POA: Diagnosis not present

## 2021-03-07 DIAGNOSIS — N1831 Chronic kidney disease, stage 3a: Secondary | ICD-10-CM | POA: Diagnosis not present

## 2021-03-20 IMAGING — US US RENAL
1 series · 14 of 25 positions shown · non-contrast
Comparison: Abdominal radiograph 12/06/2019, CT abdomen and pelvis
11/08/2019

CLINICAL DATA: Acute kidney injury question obstruction, history
diabetes mellitus, pulmonary embolism, lithotripsy 1 month ago,
recent drop in renal function from stage III to stage IV

EXAM:
RENAL / URINARY TRACT ULTRASOUND COMPLETE

[Series 1: us renal · 0.23mm/px · 14 of 55 slices shown]
[im 1/55]
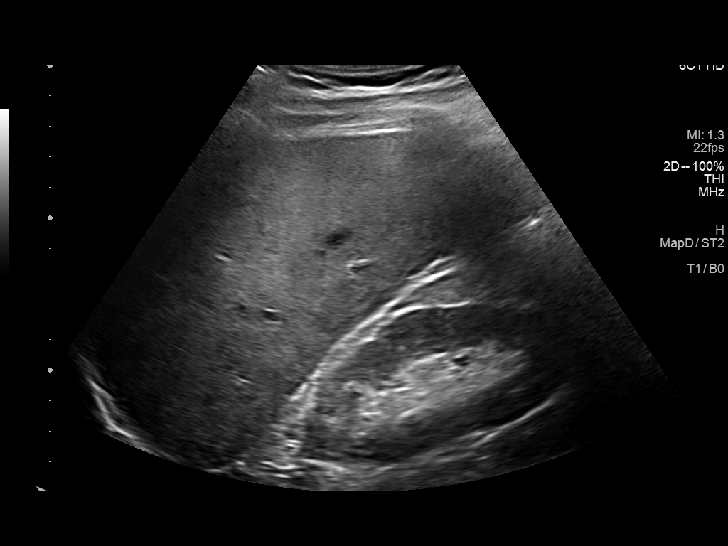
[im 5/55]
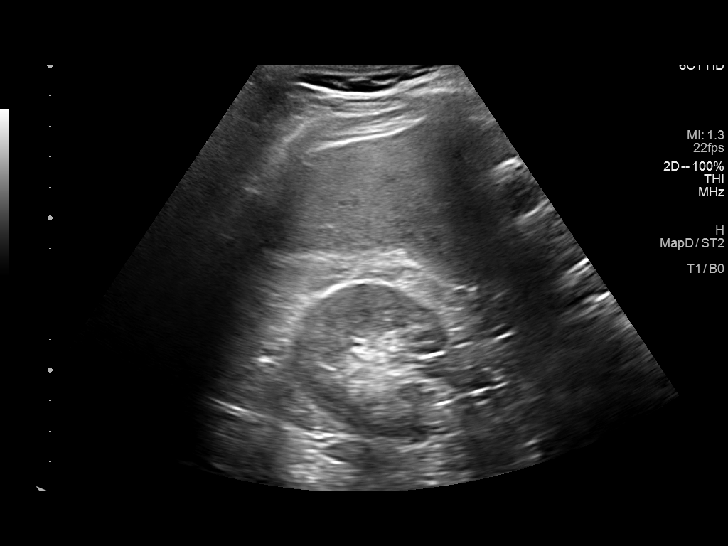
[im 10/55]
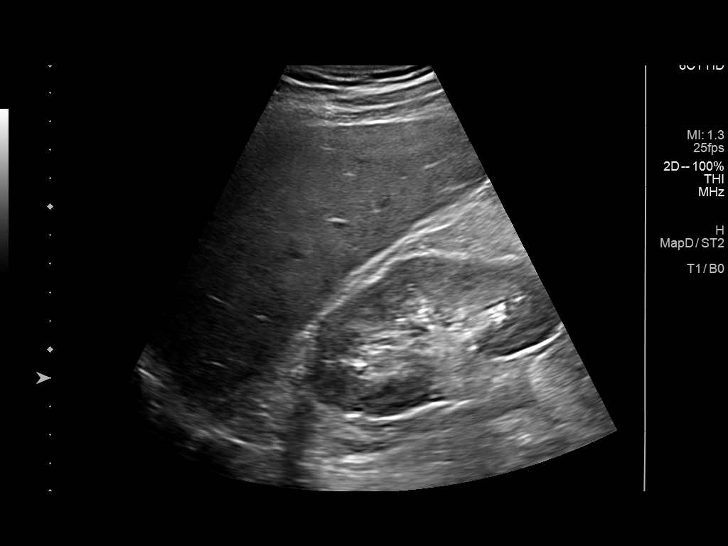
[im 14/55]
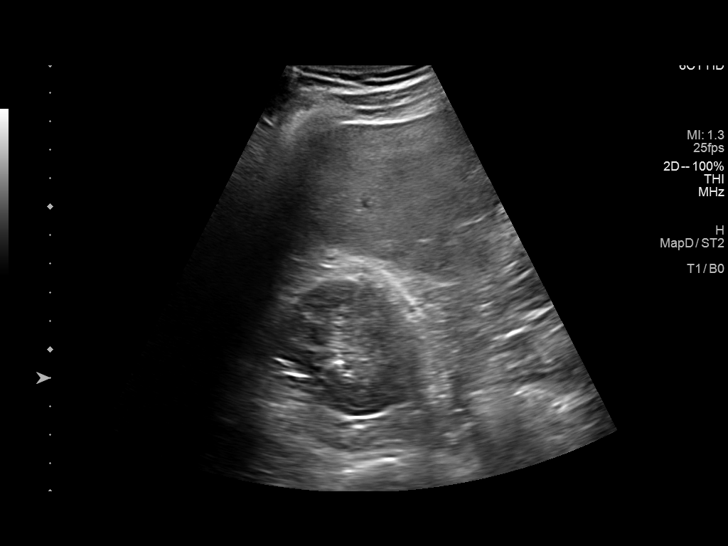
[im 19/55]
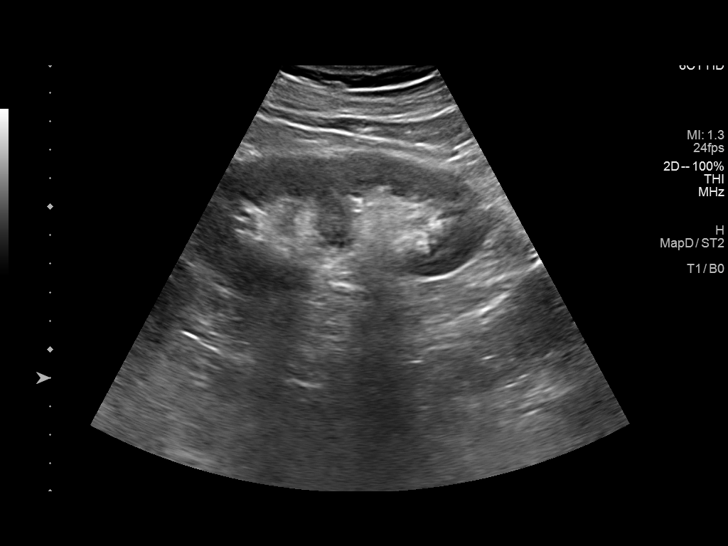
[im 21/55]
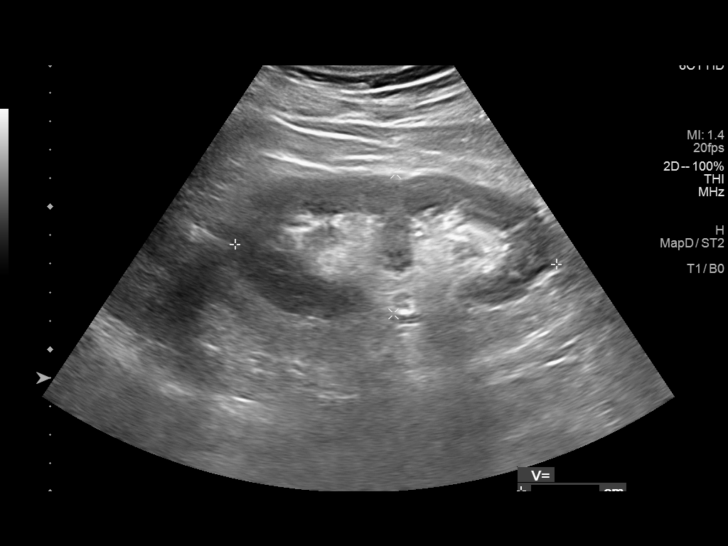
[im 25/55]
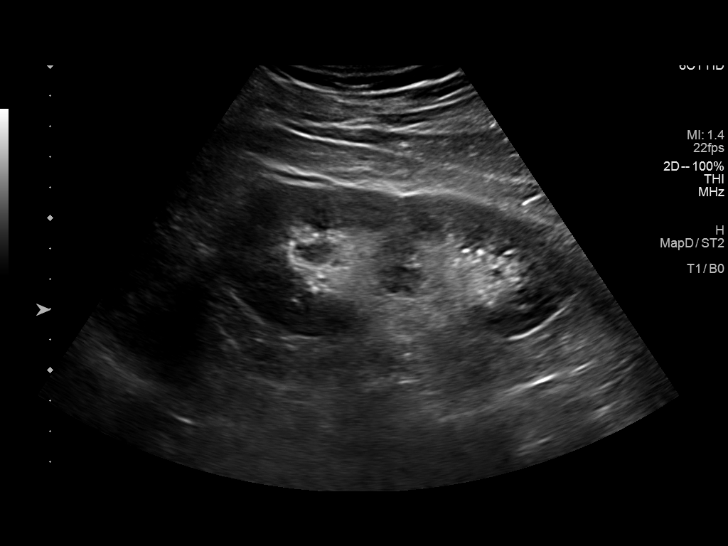
[im 30/55]
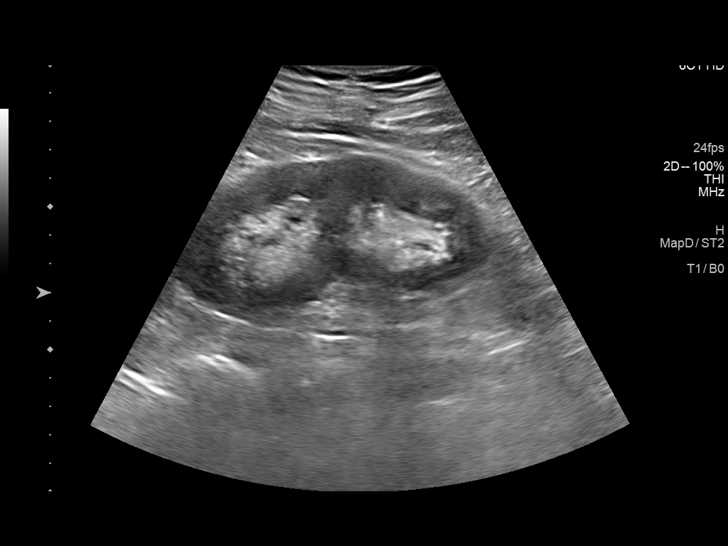
[im 34/55]
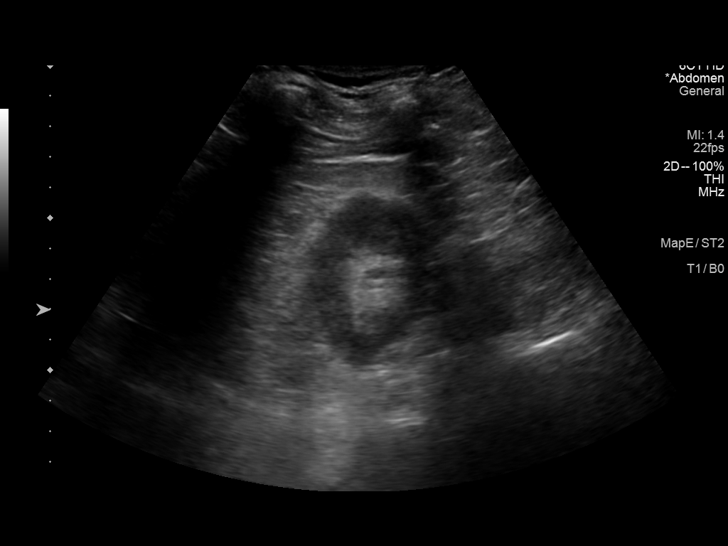
[im 37/55]
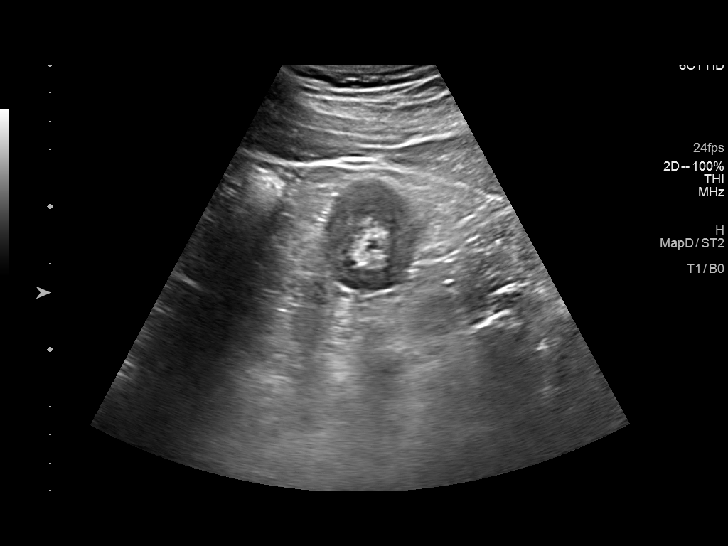
[im 41/55]
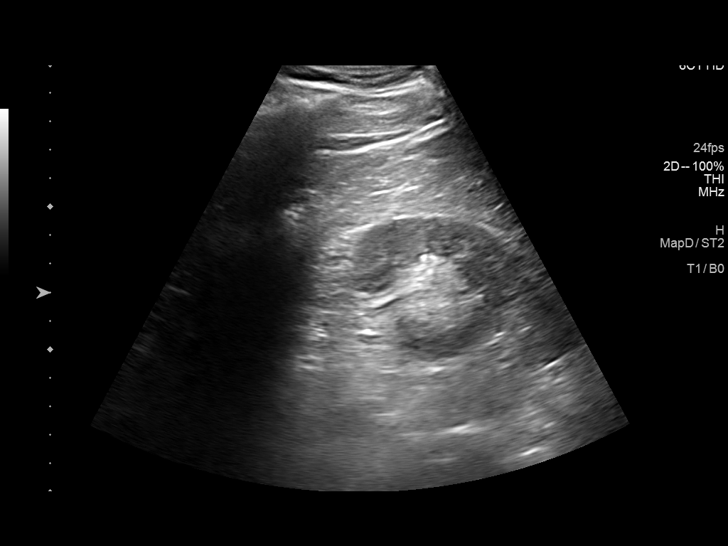
[im 46/55]
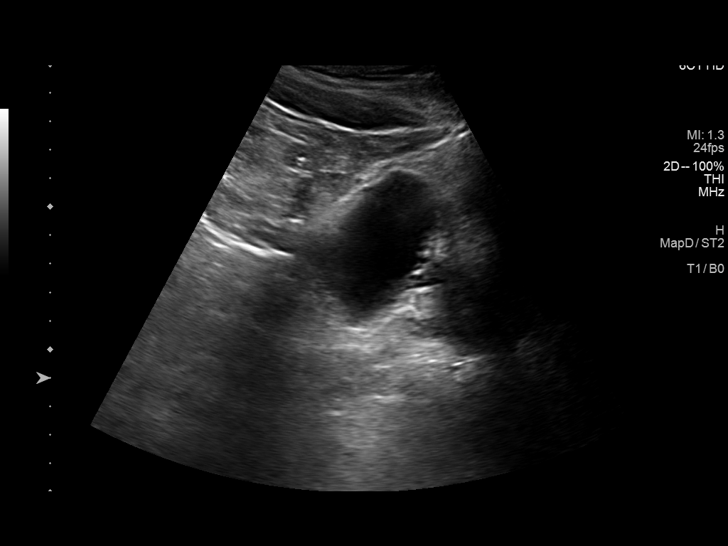
[im 50/55]
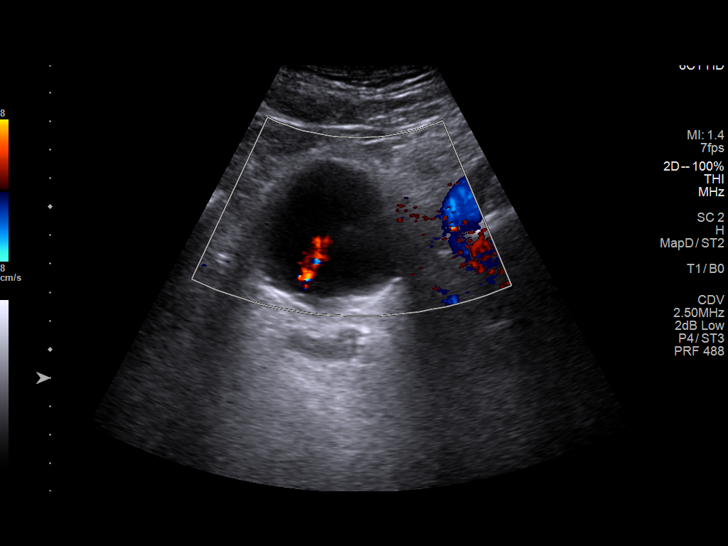
[im 55/55]
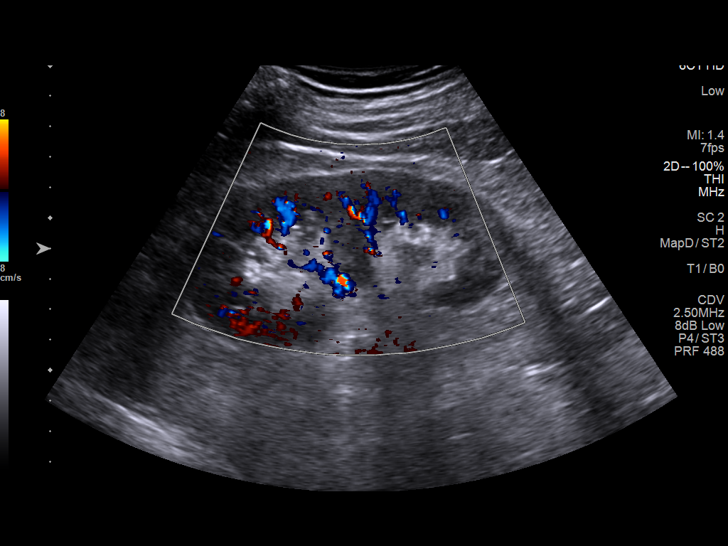

[14 of 25 positions shown; findings below may reference images not displayed]

FINDINGS: Right Kidney:

Renal measurements: 9.8 x 5.2 x 4.9 cm = volume: 129 mL. Normal
cortical thickness. Upper normal cortical echogenicity. No mass,
hydronephrosis or shadowing calcification

Left Kidney:

Renal measurements: 11.3 x 4.9 x 5.4 cm = volume: 157 mL. Normal
cortical thickness. Upper normal cortical echogenicity. Prominent
pyramid at mid kidney. Minimal collecting system dilatation, did not
change following voiding. No renal mass or shadowing calcification.

Bladder:

Partially distended, grossly unremarkable. BILATERAL ureteral jets
noted.

Other:

N/A
IMPRESSION: Minimal collecting system dilatation LEFT kidney, similar to prior
CT.

No RIGHT renal sonographic abnormalities identified.

## 2021-05-21 DIAGNOSIS — I825Z1 Chronic embolism and thrombosis of unspecified deep veins of right distal lower extremity: Secondary | ICD-10-CM | POA: Diagnosis not present

## 2021-05-21 DIAGNOSIS — Z86718 Personal history of other venous thrombosis and embolism: Secondary | ICD-10-CM | POA: Diagnosis not present

## 2021-05-21 DIAGNOSIS — K219 Gastro-esophageal reflux disease without esophagitis: Secondary | ICD-10-CM | POA: Diagnosis not present

## 2021-05-21 DIAGNOSIS — Z23 Encounter for immunization: Secondary | ICD-10-CM | POA: Diagnosis not present

## 2021-05-21 DIAGNOSIS — Z125 Encounter for screening for malignant neoplasm of prostate: Secondary | ICD-10-CM | POA: Diagnosis not present

## 2021-05-21 DIAGNOSIS — E1169 Type 2 diabetes mellitus with other specified complication: Secondary | ICD-10-CM | POA: Diagnosis not present

## 2021-05-21 DIAGNOSIS — E785 Hyperlipidemia, unspecified: Secondary | ICD-10-CM | POA: Diagnosis not present

## 2021-05-21 DIAGNOSIS — Z86711 Personal history of pulmonary embolism: Secondary | ICD-10-CM | POA: Diagnosis not present

## 2021-05-21 DIAGNOSIS — E039 Hypothyroidism, unspecified: Secondary | ICD-10-CM | POA: Diagnosis not present

## 2021-05-21 DIAGNOSIS — N183 Chronic kidney disease, stage 3 unspecified: Secondary | ICD-10-CM | POA: Diagnosis not present

## 2021-05-21 DIAGNOSIS — Z6827 Body mass index (BMI) 27.0-27.9, adult: Secondary | ICD-10-CM | POA: Diagnosis not present

## 2021-08-21 DIAGNOSIS — Z6827 Body mass index (BMI) 27.0-27.9, adult: Secondary | ICD-10-CM | POA: Diagnosis not present

## 2021-08-21 DIAGNOSIS — E039 Hypothyroidism, unspecified: Secondary | ICD-10-CM | POA: Diagnosis not present

## 2021-08-21 DIAGNOSIS — E785 Hyperlipidemia, unspecified: Secondary | ICD-10-CM | POA: Diagnosis not present

## 2021-08-21 DIAGNOSIS — I825Z1 Chronic embolism and thrombosis of unspecified deep veins of right distal lower extremity: Secondary | ICD-10-CM | POA: Diagnosis not present

## 2021-08-21 DIAGNOSIS — E1169 Type 2 diabetes mellitus with other specified complication: Secondary | ICD-10-CM | POA: Diagnosis not present

## 2021-08-21 DIAGNOSIS — N183 Chronic kidney disease, stage 3 unspecified: Secondary | ICD-10-CM | POA: Diagnosis not present

## 2021-11-15 DIAGNOSIS — E785 Hyperlipidemia, unspecified: Secondary | ICD-10-CM | POA: Diagnosis not present

## 2021-11-15 DIAGNOSIS — Z0000 Encounter for general adult medical examination without abnormal findings: Secondary | ICD-10-CM | POA: Diagnosis not present

## 2021-11-15 DIAGNOSIS — Z139 Encounter for screening, unspecified: Secondary | ICD-10-CM | POA: Diagnosis not present

## 2021-11-15 DIAGNOSIS — Z9181 History of falling: Secondary | ICD-10-CM | POA: Diagnosis not present

## 2021-11-15 DIAGNOSIS — Z1331 Encounter for screening for depression: Secondary | ICD-10-CM | POA: Diagnosis not present

## 2021-11-18 DIAGNOSIS — Z6827 Body mass index (BMI) 27.0-27.9, adult: Secondary | ICD-10-CM | POA: Diagnosis not present

## 2021-11-18 DIAGNOSIS — E039 Hypothyroidism, unspecified: Secondary | ICD-10-CM | POA: Diagnosis not present

## 2021-11-18 DIAGNOSIS — Z9181 History of falling: Secondary | ICD-10-CM | POA: Diagnosis not present

## 2021-11-18 DIAGNOSIS — E1169 Type 2 diabetes mellitus with other specified complication: Secondary | ICD-10-CM | POA: Diagnosis not present

## 2021-11-18 DIAGNOSIS — Z1331 Encounter for screening for depression: Secondary | ICD-10-CM | POA: Diagnosis not present

## 2021-11-18 DIAGNOSIS — K219 Gastro-esophageal reflux disease without esophagitis: Secondary | ICD-10-CM | POA: Diagnosis not present

## 2021-11-18 DIAGNOSIS — I825Z1 Chronic embolism and thrombosis of unspecified deep veins of right distal lower extremity: Secondary | ICD-10-CM | POA: Diagnosis not present

## 2021-11-18 DIAGNOSIS — N183 Chronic kidney disease, stage 3 unspecified: Secondary | ICD-10-CM | POA: Diagnosis not present

## 2021-11-18 DIAGNOSIS — E785 Hyperlipidemia, unspecified: Secondary | ICD-10-CM | POA: Diagnosis not present

## 2022-01-20 DIAGNOSIS — D235 Other benign neoplasm of skin of trunk: Secondary | ICD-10-CM | POA: Diagnosis not present

## 2022-01-20 DIAGNOSIS — Z85828 Personal history of other malignant neoplasm of skin: Secondary | ICD-10-CM | POA: Diagnosis not present

## 2022-01-20 DIAGNOSIS — Z08 Encounter for follow-up examination after completed treatment for malignant neoplasm: Secondary | ICD-10-CM | POA: Diagnosis not present

## 2022-01-20 DIAGNOSIS — R229 Localized swelling, mass and lump, unspecified: Secondary | ICD-10-CM | POA: Diagnosis not present

## 2022-01-20 DIAGNOSIS — L821 Other seborrheic keratosis: Secondary | ICD-10-CM | POA: Diagnosis not present

## 2022-02-06 DIAGNOSIS — R7303 Prediabetes: Secondary | ICD-10-CM | POA: Diagnosis not present

## 2022-02-06 DIAGNOSIS — N1831 Chronic kidney disease, stage 3a: Secondary | ICD-10-CM | POA: Diagnosis not present

## 2022-02-06 DIAGNOSIS — I129 Hypertensive chronic kidney disease with stage 1 through stage 4 chronic kidney disease, or unspecified chronic kidney disease: Secondary | ICD-10-CM | POA: Diagnosis not present

## 2022-02-06 DIAGNOSIS — N2581 Secondary hyperparathyroidism of renal origin: Secondary | ICD-10-CM | POA: Diagnosis not present

## 2022-02-19 DIAGNOSIS — N183 Chronic kidney disease, stage 3 unspecified: Secondary | ICD-10-CM | POA: Diagnosis not present

## 2022-02-19 DIAGNOSIS — K219 Gastro-esophageal reflux disease without esophagitis: Secondary | ICD-10-CM | POA: Diagnosis not present

## 2022-02-19 DIAGNOSIS — I825Z1 Chronic embolism and thrombosis of unspecified deep veins of right distal lower extremity: Secondary | ICD-10-CM | POA: Diagnosis not present

## 2022-02-19 DIAGNOSIS — E1169 Type 2 diabetes mellitus with other specified complication: Secondary | ICD-10-CM | POA: Diagnosis not present

## 2022-02-19 DIAGNOSIS — E785 Hyperlipidemia, unspecified: Secondary | ICD-10-CM | POA: Diagnosis not present

## 2022-02-19 DIAGNOSIS — E039 Hypothyroidism, unspecified: Secondary | ICD-10-CM | POA: Diagnosis not present

## 2022-03-06 DIAGNOSIS — N1831 Chronic kidney disease, stage 3a: Secondary | ICD-10-CM | POA: Diagnosis not present

## 2022-03-19 DIAGNOSIS — E785 Hyperlipidemia, unspecified: Secondary | ICD-10-CM | POA: Diagnosis not present

## 2022-03-19 DIAGNOSIS — G729 Myopathy, unspecified: Secondary | ICD-10-CM | POA: Diagnosis not present

## 2022-04-07 DIAGNOSIS — E119 Type 2 diabetes mellitus without complications: Secondary | ICD-10-CM | POA: Diagnosis not present

## 2022-04-07 DIAGNOSIS — H524 Presbyopia: Secondary | ICD-10-CM | POA: Diagnosis not present

## 2022-04-07 DIAGNOSIS — H43812 Vitreous degeneration, left eye: Secondary | ICD-10-CM | POA: Diagnosis not present

## 2022-04-07 DIAGNOSIS — H2513 Age-related nuclear cataract, bilateral: Secondary | ICD-10-CM | POA: Diagnosis not present

## 2022-04-07 DIAGNOSIS — D3132 Benign neoplasm of left choroid: Secondary | ICD-10-CM | POA: Diagnosis not present

## 2022-04-07 DIAGNOSIS — H5203 Hypermetropia, bilateral: Secondary | ICD-10-CM | POA: Diagnosis not present

## 2022-05-23 DIAGNOSIS — N183 Chronic kidney disease, stage 3 unspecified: Secondary | ICD-10-CM | POA: Diagnosis not present

## 2022-05-23 DIAGNOSIS — Z125 Encounter for screening for malignant neoplasm of prostate: Secondary | ICD-10-CM | POA: Diagnosis not present

## 2022-05-23 DIAGNOSIS — I825Z1 Chronic embolism and thrombosis of unspecified deep veins of right distal lower extremity: Secondary | ICD-10-CM | POA: Diagnosis not present

## 2022-05-23 DIAGNOSIS — K219 Gastro-esophageal reflux disease without esophagitis: Secondary | ICD-10-CM | POA: Diagnosis not present

## 2022-05-23 DIAGNOSIS — M25512 Pain in left shoulder: Secondary | ICD-10-CM | POA: Diagnosis not present

## 2022-05-23 DIAGNOSIS — E785 Hyperlipidemia, unspecified: Secondary | ICD-10-CM | POA: Diagnosis not present

## 2022-05-23 DIAGNOSIS — Z23 Encounter for immunization: Secondary | ICD-10-CM | POA: Diagnosis not present

## 2022-05-23 DIAGNOSIS — E1169 Type 2 diabetes mellitus with other specified complication: Secondary | ICD-10-CM | POA: Diagnosis not present

## 2022-05-23 DIAGNOSIS — E039 Hypothyroidism, unspecified: Secondary | ICD-10-CM | POA: Diagnosis not present

## 2022-05-28 DIAGNOSIS — M25512 Pain in left shoulder: Secondary | ICD-10-CM | POA: Diagnosis not present

## 2022-08-26 DIAGNOSIS — E1169 Type 2 diabetes mellitus with other specified complication: Secondary | ICD-10-CM | POA: Diagnosis not present

## 2022-08-26 DIAGNOSIS — I825Z1 Chronic embolism and thrombosis of unspecified deep veins of right distal lower extremity: Secondary | ICD-10-CM | POA: Diagnosis not present

## 2022-08-26 DIAGNOSIS — K219 Gastro-esophageal reflux disease without esophagitis: Secondary | ICD-10-CM | POA: Diagnosis not present

## 2022-08-26 DIAGNOSIS — E039 Hypothyroidism, unspecified: Secondary | ICD-10-CM | POA: Diagnosis not present

## 2022-08-26 DIAGNOSIS — E785 Hyperlipidemia, unspecified: Secondary | ICD-10-CM | POA: Diagnosis not present

## 2022-08-26 DIAGNOSIS — Z6827 Body mass index (BMI) 27.0-27.9, adult: Secondary | ICD-10-CM | POA: Diagnosis not present

## 2022-08-26 DIAGNOSIS — N183 Chronic kidney disease, stage 3 unspecified: Secondary | ICD-10-CM | POA: Diagnosis not present

## 2022-10-21 ENCOUNTER — Other Ambulatory Visit: Payer: Self-pay

## 2022-10-21 DIAGNOSIS — E079 Disorder of thyroid, unspecified: Secondary | ICD-10-CM | POA: Insufficient documentation

## 2022-10-21 DIAGNOSIS — N189 Chronic kidney disease, unspecified: Secondary | ICD-10-CM | POA: Insufficient documentation

## 2022-10-21 DIAGNOSIS — C801 Malignant (primary) neoplasm, unspecified: Secondary | ICD-10-CM | POA: Insufficient documentation

## 2022-10-21 HISTORY — DX: Disorder of thyroid, unspecified: E07.9

## 2022-10-27 ENCOUNTER — Ambulatory Visit: Payer: Medicare Other | Attending: Cardiology | Admitting: Cardiology

## 2022-10-27 ENCOUNTER — Encounter: Payer: Self-pay | Admitting: Cardiology

## 2022-10-27 VITALS — BP 130/70 | HR 93 | Ht 68.0 in | Wt 182.6 lb

## 2022-10-27 DIAGNOSIS — Z86718 Personal history of other venous thrombosis and embolism: Secondary | ICD-10-CM

## 2022-10-27 DIAGNOSIS — E782 Mixed hyperlipidemia: Secondary | ICD-10-CM | POA: Diagnosis not present

## 2022-10-27 DIAGNOSIS — E088 Diabetes mellitus due to underlying condition with unspecified complications: Secondary | ICD-10-CM | POA: Diagnosis not present

## 2022-10-27 DIAGNOSIS — Z86711 Personal history of pulmonary embolism: Secondary | ICD-10-CM

## 2022-10-27 HISTORY — DX: Personal history of other venous thrombosis and embolism: Z86.718

## 2022-10-27 NOTE — Progress Notes (Signed)
Cardiology Office Note:    Date:  10/27/2022   ID:  Brandon Norman, DOB [birth year], MRN [id number]  PCP:  Brandon Fusi, MD  Cardiologist:  Brandon Brothers, MD   Referring MD: Brandon Fusi, MD    ASSESSMENT:    1. History of pulmonary embolism   2. Diabetes mellitus due to underlying condition with unspecified complications   3. History of DVT (deep vein thrombosis)   4. Mixed hyperlipidemia    PLAN:    In order of problems listed above:  Primary prevention stressed with the patient.  Importance of compliance with diet medication stressed and vocalized understanding. History of PE and DVT: On anticoagulation followed by primary care.  Benefits risks of anticoagulation explained and he vocalized understanding and questions were answered to satisfaction. Diabetes mellitus: Diet emphasized.  He walks at least 2 to 3 miles on a regular basis and I congratulated him. Mixed dyslipidemia: On fenofibrate and ezetimibe.  I told him that in view of his muscles pain that he should consider rheumatologist and he is agreeable.  He will try to set up the appointment himself for get in touch with his primary care if he needs a referral. Patient will be seen in follow-up appointment in 6 months or earlier if the patient has any concerns.    Medication Adjustments/Labs and Tests Ordered: Current medicines are reviewed at length with the patient today.  Concerns regarding medicines are outlined above.  No orders of the defined types were placed in this encounter.  No orders of the defined types were placed in this encounter.    No chief complaint on file.    History of Present Illness:    Brandon Norman is a 67 y.o. male.  Patient has past medical history of PE, diabetes mellitus, history of DVT.  He is on anticoagulation.  He has history of dyslipidemia.  He mentions to me that he has been having muscle aches.  He thought that his muscle aches were from statin medications but  even after discontinuing the medications he still has them.  No chest pain orthopnea or PND.  He takes care of activities of daily living.  He walks 2 to 3 miles a day on a daily basis.  At the time of my evaluation, the patient is alert awake oriented and in no distress.  Past Medical History:  Diagnosis Date   Acute DVT (deep venous thrombosis) 04/03/2020   Cancer    Chronic kidney disease    Diabetes mellitus due to underlying condition with unspecified complications 07/30/2015   History of colon polyps 10/01/2011   tubular adenomas   History of pulmonary embolism 02/03/2019   Hyperlipidemia 04/07/2017   Pulmonary embolism 2015   Thyroid disease     Past Surgical History:  Procedure Laterality Date   COLONOSCOPY  11/28/2015   Colonic polyps status post polypectomy. Mild colonic diverticulosis.    NO PAST SURGERIES     WISDOM TOOTH EXTRACTION  1992    Current Medications: Current Meds  Medication Sig   ezetimibe (ZETIA) 10 MG tablet Take 10 mg by mouth daily.   FARXIGA 10 MG TABS tablet Take 10 mg by mouth daily.   fenofibrate 160 MG tablet Take 160 mg by mouth daily.   glimepiride (AMARYL) 2 MG tablet Take 1 tablet by mouth daily.   levothyroxine (SYNTHROID) 75 MCG tablet Take 75 mcg by mouth daily.   Omega-3 Fatty Acids (FISH OIL) 1000 MG CAPS Take 2  capsules (2,000 mg total) by mouth 2 (two) times daily.   rivaroxaban (XARELTO) 20 MG TABS tablet Take 20 mg by mouth daily.   TRULICITY 0.75 MG/0.5ML SOPN Inject 0.5 mLs into the skin once a week.     Allergies:   Patient has no known allergies.   Social History   Socioeconomic History   Marital status: Married    Spouse name: Brandon Norman   Number of children: 1   Years of education: Not on file   Highest education level: Not on file  Occupational History   Not on file  Tobacco Use   Smoking status: Never   Smokeless tobacco: Never  Vaping Use   Vaping Use: Never used  Substance and Sexual Activity   Alcohol use:  Yes    Comment: occ   Drug use: No   Sexual activity: Not on file  Other Topics Concern   Not on file  Social History Narrative   Not on file   Social Determinants of Health   Financial Resource Strain: Not on file  Food Insecurity: Not on file  Transportation Needs: Not on file  Physical Activity: Not on file  Stress: Not on file  Social Connections: Not on file     Family History: The patient's family history includes Heart attack in his mother; Heart disease in his mother; Thyroid disease in his sister and sister. There is no history of Colon cancer, Esophageal cancer, Rectal cancer, or Stomach cancer.  ROS:   Please see the history of present illness.    All other systems reviewed and are negative.  EKGs/Labs/Other Studies Reviewed:    The following studies were reviewed today: EKG reveals sinus rhythm and nonspecific ST-T changes   Recent Labs: No results found for requested labs within last 365 days.  Recent Lipid Panel No results found for: "CHOL", "TRIG", "HDL", "CHOLHDL", "VLDL", "LDLCALC", "LDLDIRECT"  Physical Exam:    VS:  BP 130/70   Pulse 93   Ht  (1.727 m)   Wt 182 lb 9.6 oz (82.8 kg)   SpO2 95%   BMI 27.76 kg/m     Wt Readings from Last 3 Encounters:  10/27/22 182 lb 9.6 oz (82.8 kg)  12/26/20 183 lb (83 kg)  10/24/20 183 lb 4 oz (83.1 kg)     GEN: Patient is in no acute distress HEENT: Normal NECK: No JVD; No carotid bruits LYMPHATICS: No lymphadenopathy CARDIAC: Hear sounds regular, 2/6 systolic murmur at the apex. RESPIRATORY:  Clear to auscultation without rales, wheezing or rhonchi  ABDOMEN: Soft, non-tender, non-distended MUSCULOSKELETAL:  No edema; No deformity  SKIN: Warm and dry NEUROLOGIC:  Alert and oriented x 3 PSYCHIATRIC:  Normal affect   Signed, Brandon Brothers, MD  10/27/2022 2:36 PM    [facility]

## 2022-10-27 NOTE — Patient Instructions (Signed)
Medication Instructions:  Your physician recommends that you continue on your current medications as directed. Please refer to the Current Medication list given to you today.  *If you need a refill on your cardiac medications before your next appointment, please call your pharmacy*   Lab Work: None ordered If you have labs (blood work) drawn today and your tests are completely normal, you will receive your results only by: MyChart Message (if you have MyChart) OR A paper copy in the mail If you have any lab test that is abnormal or we need to change your treatment, we will call you to review the results.   Testing/Procedures: None ordered   Follow-Up: At [facility], you and your health needs are our priority.  As part of our continuing mission to provide you with exceptional heart care, we have created designated Provider Care Teams.  These Care Teams include your primary Cardiologist (physician) and Advanced Practice Providers (APPs -  Physician Assistants and Nurse Practitioners) who all work together to provide you with the care you need, when you need it.  We recommend signing up for the patient portal called "MyChart".  Sign up information is provided on this After Visit Summary.  MyChart is used to connect with patients for Virtual Visits (Telemedicine).  Patients are able to view lab/test results, encounter notes, upcoming appointments, etc.  Non-urgent messages can be sent to your provider as well.   To learn more about what you can do with MyChart, go to [email or web address].    Your next appointment:   9 month(s)  The format for your next appointment:   In Person  Provider:   Rajan Revankar, MD    Other Instructions none  Important Information About Sugar       

## 2022-11-26 DIAGNOSIS — E039 Hypothyroidism, unspecified: Secondary | ICD-10-CM | POA: Diagnosis not present

## 2022-11-26 DIAGNOSIS — Z1331 Encounter for screening for depression: Secondary | ICD-10-CM | POA: Diagnosis not present

## 2022-11-26 DIAGNOSIS — K219 Gastro-esophageal reflux disease without esophagitis: Secondary | ICD-10-CM | POA: Diagnosis not present

## 2022-11-26 DIAGNOSIS — N183 Chronic kidney disease, stage 3 unspecified: Secondary | ICD-10-CM | POA: Diagnosis not present

## 2022-11-26 DIAGNOSIS — Z9181 History of falling: Secondary | ICD-10-CM | POA: Diagnosis not present

## 2022-11-26 DIAGNOSIS — E1169 Type 2 diabetes mellitus with other specified complication: Secondary | ICD-10-CM | POA: Diagnosis not present

## 2022-11-26 DIAGNOSIS — E785 Hyperlipidemia, unspecified: Secondary | ICD-10-CM | POA: Diagnosis not present

## 2022-11-26 DIAGNOSIS — M7918 Myalgia, other site: Secondary | ICD-10-CM | POA: Diagnosis not present

## 2022-11-26 DIAGNOSIS — I825Z1 Chronic embolism and thrombosis of unspecified deep veins of right distal lower extremity: Secondary | ICD-10-CM | POA: Diagnosis not present

## 2022-12-23 ENCOUNTER — Other Ambulatory Visit: Payer: Self-pay | Admitting: Internal Medicine

## 2023-01-21 DIAGNOSIS — D229 Melanocytic nevi, unspecified: Secondary | ICD-10-CM | POA: Diagnosis not present

## 2023-01-21 DIAGNOSIS — Z85828 Personal history of other malignant neoplasm of skin: Secondary | ICD-10-CM | POA: Diagnosis not present

## 2023-01-21 DIAGNOSIS — Z08 Encounter for follow-up examination after completed treatment for malignant neoplasm: Secondary | ICD-10-CM | POA: Diagnosis not present

## 2023-01-21 DIAGNOSIS — R229 Localized swelling, mass and lump, unspecified: Secondary | ICD-10-CM | POA: Diagnosis not present

## 2023-01-21 DIAGNOSIS — L738 Other specified follicular disorders: Secondary | ICD-10-CM | POA: Diagnosis not present

## 2023-01-28 DIAGNOSIS — I2699 Other pulmonary embolism without acute cor pulmonale: Secondary | ICD-10-CM | POA: Diagnosis not present

## 2023-01-28 DIAGNOSIS — E1122 Type 2 diabetes mellitus with diabetic chronic kidney disease: Secondary | ICD-10-CM | POA: Diagnosis not present

## 2023-01-28 DIAGNOSIS — M791 Myalgia, unspecified site: Secondary | ICD-10-CM | POA: Diagnosis not present

## 2023-01-28 DIAGNOSIS — N1831 Chronic kidney disease, stage 3a: Secondary | ICD-10-CM | POA: Diagnosis not present

## 2023-01-28 DIAGNOSIS — N2581 Secondary hyperparathyroidism of renal origin: Secondary | ICD-10-CM | POA: Diagnosis not present

## 2023-01-28 DIAGNOSIS — I129 Hypertensive chronic kidney disease with stage 1 through stage 4 chronic kidney disease, or unspecified chronic kidney disease: Secondary | ICD-10-CM | POA: Diagnosis not present

## 2023-02-26 DIAGNOSIS — I825Z1 Chronic embolism and thrombosis of unspecified deep veins of right distal lower extremity: Secondary | ICD-10-CM | POA: Diagnosis not present

## 2023-02-26 DIAGNOSIS — E785 Hyperlipidemia, unspecified: Secondary | ICD-10-CM | POA: Diagnosis not present

## 2023-02-26 DIAGNOSIS — E039 Hypothyroidism, unspecified: Secondary | ICD-10-CM | POA: Diagnosis not present

## 2023-02-26 DIAGNOSIS — Z6827 Body mass index (BMI) 27.0-27.9, adult: Secondary | ICD-10-CM | POA: Diagnosis not present

## 2023-02-26 DIAGNOSIS — N183 Chronic kidney disease, stage 3 unspecified: Secondary | ICD-10-CM | POA: Diagnosis not present

## 2023-02-26 DIAGNOSIS — E1169 Type 2 diabetes mellitus with other specified complication: Secondary | ICD-10-CM | POA: Diagnosis not present

## 2023-02-26 DIAGNOSIS — K219 Gastro-esophageal reflux disease without esophagitis: Secondary | ICD-10-CM | POA: Diagnosis not present

## 2023-02-26 DIAGNOSIS — M7918 Myalgia, other site: Secondary | ICD-10-CM | POA: Diagnosis not present

## 2023-04-06 DIAGNOSIS — M5416 Radiculopathy, lumbar region: Secondary | ICD-10-CM | POA: Diagnosis not present

## 2023-04-06 DIAGNOSIS — M4306 Spondylolysis, lumbar region: Secondary | ICD-10-CM | POA: Diagnosis not present

## 2023-04-06 DIAGNOSIS — M5136 Other intervertebral disc degeneration, lumbar region: Secondary | ICD-10-CM | POA: Diagnosis not present

## 2023-04-06 DIAGNOSIS — M47816 Spondylosis without myelopathy or radiculopathy, lumbar region: Secondary | ICD-10-CM | POA: Diagnosis not present

## 2023-04-06 DIAGNOSIS — M5126 Other intervertebral disc displacement, lumbar region: Secondary | ICD-10-CM | POA: Diagnosis not present

## 2023-04-09 DIAGNOSIS — H524 Presbyopia: Secondary | ICD-10-CM | POA: Diagnosis not present

## 2023-04-09 DIAGNOSIS — E119 Type 2 diabetes mellitus without complications: Secondary | ICD-10-CM | POA: Diagnosis not present

## 2023-04-09 DIAGNOSIS — D3132 Benign neoplasm of left choroid: Secondary | ICD-10-CM | POA: Diagnosis not present

## 2023-04-09 DIAGNOSIS — H5203 Hypermetropia, bilateral: Secondary | ICD-10-CM | POA: Diagnosis not present

## 2023-04-09 DIAGNOSIS — H2513 Age-related nuclear cataract, bilateral: Secondary | ICD-10-CM | POA: Diagnosis not present

## 2023-04-09 DIAGNOSIS — H43812 Vitreous degeneration, left eye: Secondary | ICD-10-CM | POA: Diagnosis not present

## 2023-04-17 ENCOUNTER — Ambulatory Visit: Payer: Medicare Other | Attending: Internal Medicine | Admitting: Internal Medicine

## 2023-04-17 ENCOUNTER — Encounter: Payer: Self-pay | Admitting: Internal Medicine

## 2023-04-17 VITALS — BP 130/81 | HR 88 | Resp 15 | Ht 67.5 in | Wt 179.0 lb

## 2023-04-17 DIAGNOSIS — Z86718 Personal history of other venous thrombosis and embolism: Secondary | ICD-10-CM | POA: Diagnosis not present

## 2023-04-17 DIAGNOSIS — M791 Myalgia, unspecified site: Secondary | ICD-10-CM | POA: Diagnosis not present

## 2023-04-17 DIAGNOSIS — E079 Disorder of thyroid, unspecified: Secondary | ICD-10-CM | POA: Diagnosis not present

## 2023-04-17 DIAGNOSIS — Z86711 Personal history of pulmonary embolism: Secondary | ICD-10-CM | POA: Diagnosis not present

## 2023-04-17 DIAGNOSIS — N1831 Chronic kidney disease, stage 3a: Secondary | ICD-10-CM | POA: Insufficient documentation

## 2023-04-17 DIAGNOSIS — R899 Unspecified abnormal finding in specimens from other organs, systems and tissues: Secondary | ICD-10-CM | POA: Diagnosis not present

## 2023-04-17 HISTORY — DX: Myalgia, unspecified site: M79.10

## 2023-04-17 NOTE — Progress Notes (Signed)
Office Visit Note  Patient: Brandon Norman             Date of Birth: June 04, 1956           MRN: [id number]             PCP: Paulina Fusi, MD Referring: Paulina Fusi, MD Visit Date: 04/17/2023  Subjective:   History of Present Illness: Brandon Norman is a 67 y.o. male here for evaluation of muscle pain in multiple areas ongoing for about the past 1 year.  Medical history is also significant for diabetes, CKD stage III, hypothyroidism, and history of multiple blood clots with saddle pulmonary embolus in 2021 associated with testosterone supplementation. He previously experienced some statin associated myalgias for which his cardiologist had switched his treatment with Crestor and Zocor that usually alleviated symptoms.  They did try this for symptoms last year but did not get any improvement.  He was referred to orthopedic surgery clinic with a evaluation in November had an x-ray apparently indicated mild osteoarthritis was treated with an intra-articular steroid injection that did not help much.  Symptoms are persistent daily tended to notice varying in severity both the arm and legs would be worse or better at the same time.  Symptoms present throughout the day sometimes bad enough to interfere with sleep.  He followed up with cardiology in April with no new concerning findings in his cholesterol or EKG or exam it was recommended to see rheumatology for the persistent symptoms.  Reports abrupt change on September 15 after sleeping he woke with low back pain started but with improvement in his leg pain.  No radiation of symptoms did have some back pain on straight leg raise at a visit with Dr. Fabio Asa and was sent for x-ray this has not yet been read.  He has tried Tylenol as needed which does not significantly improve symptoms.  He cannot take oral NSAIDs due to chronic anticoagulation and chronic kidney disease stage III.  He was prescribed tramadol he tried this just once that helped  symptoms but did not enjoy side effects of dizziness or lightheadedness sensation.   Activities of Daily Living:  Patient reports morning stiffness for 0  none .   Patient Reports nocturnal pain.  Difficulty dressing/grooming: Denies Difficulty climbing stairs: Denies Difficulty getting out of chair: Denies Difficulty using hands for taps, buttons, cutlery, and/or writing: Denies  Review of Systems  Constitutional:  Negative for fatigue.  HENT:  Negative for mouth sores and mouth dryness.   Eyes:  Negative for dryness.  Respiratory:  Positive for shortness of breath.   Cardiovascular:  Negative for chest pain and palpitations.  Gastrointestinal:  Negative for blood in stool, constipation and diarrhea.  Endocrine: Negative for increased urination.  Genitourinary:  Negative for involuntary urination.  Musculoskeletal:  Positive for myalgias, muscle tenderness and myalgias. Negative for joint pain, gait problem, joint pain, joint swelling, muscle weakness and morning stiffness.  Skin:  Negative for color change, rash, hair loss and sensitivity to sunlight.  Allergic/Immunologic: Negative for susceptible to infections.  Neurological:  Negative for dizziness and headaches.  Hematological:  Negative for swollen glands.  Psychiatric/Behavioral:  Positive for sleep disturbance. Negative for depressed mood. The patient is not nervous/anxious.     PMFS History:  Patient Active Problem List   Diagnosis Date Noted   Myalgia 04/17/2023   History of DVT (deep vein thrombosis) 10/27/2022   Cancer (HCC) 10/21/2022   Chronic kidney disease 10/21/2022  Thyroid disease 10/21/2022   Acute DVT (deep venous thrombosis) (HCC) 04/03/2020   History of pulmonary embolism 02/03/2019   Hyperlipidemia 04/07/2017   Diabetes mellitus due to underlying condition with unspecified complications (HCC) 07/30/2015   Pulmonary embolism (HCC) 2015   History of colon polyps 10/01/2011    Past Medical History:   Diagnosis Date   Acute DVT (deep venous thrombosis) (HCC) 04/03/2020   Basal cell carcinoma    Cancer (HCC)    Chronic kidney disease    Diabetes mellitus due to underlying condition with unspecified complications (HCC) 07/30/2015   History of colon polyps 10/01/2011   tubular adenomas   History of pulmonary embolism 02/03/2019   Hyperlipidemia 04/07/2017   Pulmonary embolism (HCC) 2015   Thyroid disease     Family History  Problem Relation Age of Onset   Heart attack Mother    Heart disease Mother    Thyroid disease Sister    Thyroid disease Sister    Colon cancer Neg Hx    Esophageal cancer Neg Hx    Rectal cancer Neg Hx    Stomach cancer Neg Hx    Past Surgical History:  Procedure Laterality Date   COLONOSCOPY  11/28/2015   Colonic polyps status post polypectomy. Mild colonic diverticulosis.    WISDOM TOOTH EXTRACTION  1992   Social History   Social History Narrative   Not on file    There is no immunization history on file for this patient.   Objective: Vital Signs: BP 130/81 (BP Location: Left Arm, Patient Position: Sitting, Cuff Size: Normal)   Pulse 88   Resp 15   Ht 5' 7.5" (1.715 m)   Wt 179 lb (81.2 kg)   BMI 27.62 kg/m    Physical Exam HENT:     Mouth/Throat:     Mouth: Mucous membranes are moist.     Pharynx: Oropharynx is clear.  Eyes:     Conjunctiva/sclera: Conjunctivae normal.  Cardiovascular:     Rate and Rhythm: Normal rate and regular rhythm.  Pulmonary:     Effort: Pulmonary effort is normal.     Breath sounds: Normal breath sounds.  Musculoskeletal:     Right lower leg: No edema.     Left lower leg: No edema.  Lymphadenopathy:     Cervical: No cervical adenopathy.  Skin:    General: Skin is warm and dry.     Findings: No rash.  Neurological:     Mental Status: He is alert.  Psychiatric:        Mood and Affect: Mood normal.      Musculoskeletal Exam:  Shoulders full ROM no tenderness or swelling, some pain provoked  around midpoint humerus or deltoid insertion with internal rotation resisted abduction, no tenderness to direct pressure Elbows full ROM no tenderness or swelling Wrists full ROM no tenderness or swelling Fingers full ROM no tenderness or swelling No midline or paraspinal tenderness to pressure in the low back, mild back pain is provoked with right-sided straight leg raise no radiation Knees full ROM no tenderness or swelling Ankles full ROM no tenderness or swelling   Investigation: No additional findings.  Imaging: No results found.  Recent Labs: Lab Results  Component Value Date   WBC 6.2 10/30/2009   HGB 16.7 10/30/2009   PLT 254 10/30/2009   NA 138 10/30/2009   K 4.5 10/30/2009   CL 104 10/30/2009   CO2 21 10/30/2009   GLUCOSE 125 (H) 10/30/2009   BUN 23 10/30/2009  CREATININE 1.52 (H) 10/30/2009   BILITOT 0.6 10/30/2009   ALKPHOS 52 10/30/2009   AST 19 10/30/2009   ALT 24 10/30/2009   PROT 7.4 10/30/2009   ALBUMIN 4.4 10/30/2009   CALCIUM 9.0 10/30/2009    Speciality Comments: No specialty comments available.  Procedures:  No procedures performed Allergies: Patient has no known allergies.   Assessment / Plan:     Visit Diagnoses: Myalgia - Plan: Aldolase, C-reactive protein, T4, T3, 3-Hydroxy-3-Methylglutaryl-Coenzyme A Reductase (HMGCR) AB (IgG)  Persistent myalgia in proximal muscles no appreciable changes on exam there is no weakness and CK was normal.  Typically statin induced myopathy would have improved with medications which and now a year out from stopping this.  Will check HMG CR antibody if positive would cause inflammation lasting past cessation of treatment but less likely without more significant myopathy.  Also checking aldolase and CRP for evidence of active inflammation.  If labs are abnormal would recommend a trial of glucocorticoid treatment to assess response.  Otherwise would continue conservative care for now trial of adding muscle relaxers  and possible physical therapy.  Stage 3a chronic kidney disease (HCC)  Not a candidate for oral NSAIDs due to renal disease.  History of pulmonary embolism History of DVT (deep vein thrombosis)  No signs or symptoms concerning for DVT recurrence or for superficial thrombophlebitis.  He is on Xarelto maintenance with no interruptions.  Thyroid disease  Hypothyroidism on levothyroxine supplementation recent TSH was normal.  Will also check T3 and T4 to confirm this.  History of low T complicated by clot on supplement but hormonal deficiency could contribute to myalgia.  Orders: Orders Placed This Encounter  Procedures   Aldolase   C-reactive protein   T4   T3   3-Hydroxy-3-Methylglutaryl-Coenzyme A Reductase (HMGCR) AB (IgG)   No orders of the defined types were placed in this encounter.    Follow-Up Instructions: No follow-ups on file.   Fuller Plan, MD  Note - This record has been created using [employer].  Chart creation errors have been sought, but may not always  have been located. Such creation errors do not reflect on  the standard of medical care.

## 2023-04-23 DIAGNOSIS — M5126 Other intervertebral disc displacement, lumbar region: Secondary | ICD-10-CM | POA: Diagnosis not present

## 2023-04-23 DIAGNOSIS — M4306 Spondylolysis, lumbar region: Secondary | ICD-10-CM | POA: Diagnosis not present

## 2023-04-23 DIAGNOSIS — M4807 Spinal stenosis, lumbosacral region: Secondary | ICD-10-CM | POA: Diagnosis not present

## 2023-04-23 DIAGNOSIS — M5136 Other intervertebral disc degeneration, lumbar region with discogenic back pain only: Secondary | ICD-10-CM | POA: Diagnosis not present

## 2023-04-23 DIAGNOSIS — M48061 Spinal stenosis, lumbar region without neurogenic claudication: Secondary | ICD-10-CM | POA: Diagnosis not present

## 2023-04-24 LAB — T4: T4, Total: 10.2 ug/dL (ref 4.9–10.5)

## 2023-04-24 LAB — ALDOLASE: Aldolase: 4.8 U/L (ref <=8.1)

## 2023-04-24 LAB — T3: T3, Total: 94 ng/dL (ref 76–181)

## 2023-04-24 LAB — C-REACTIVE PROTEIN: CRP: <3 mg/L (ref <8.0)

## 2023-04-24 LAB — HMGCR AB (IGG): HMGCR AB (IGG): <2 CU (ref <20)

## 2023-04-27 DIAGNOSIS — Z0000 Encounter for general adult medical examination without abnormal findings: Secondary | ICD-10-CM | POA: Diagnosis not present

## 2023-04-27 DIAGNOSIS — Z9181 History of falling: Secondary | ICD-10-CM | POA: Diagnosis not present

## 2023-05-20 DIAGNOSIS — M4726 Other spondylosis with radiculopathy, lumbar region: Secondary | ICD-10-CM | POA: Diagnosis not present

## 2023-05-20 DIAGNOSIS — M48062 Spinal stenosis, lumbar region with neurogenic claudication: Secondary | ICD-10-CM | POA: Diagnosis not present

## 2023-06-01 DIAGNOSIS — M48062 Spinal stenosis, lumbar region with neurogenic claudication: Secondary | ICD-10-CM | POA: Diagnosis not present

## 2023-06-01 DIAGNOSIS — Z6827 Body mass index (BMI) 27.0-27.9, adult: Secondary | ICD-10-CM | POA: Diagnosis not present

## 2023-06-05 DIAGNOSIS — Z125 Encounter for screening for malignant neoplasm of prostate: Secondary | ICD-10-CM | POA: Diagnosis not present

## 2023-06-05 DIAGNOSIS — Z23 Encounter for immunization: Secondary | ICD-10-CM | POA: Diagnosis not present

## 2023-06-05 DIAGNOSIS — E039 Hypothyroidism, unspecified: Secondary | ICD-10-CM | POA: Diagnosis not present

## 2023-06-05 DIAGNOSIS — E1169 Type 2 diabetes mellitus with other specified complication: Secondary | ICD-10-CM | POA: Diagnosis not present

## 2023-06-05 DIAGNOSIS — K219 Gastro-esophageal reflux disease without esophagitis: Secondary | ICD-10-CM | POA: Diagnosis not present

## 2023-06-05 DIAGNOSIS — N183 Chronic kidney disease, stage 3 unspecified: Secondary | ICD-10-CM | POA: Diagnosis not present

## 2023-06-05 DIAGNOSIS — Z6827 Body mass index (BMI) 27.0-27.9, adult: Secondary | ICD-10-CM | POA: Diagnosis not present

## 2023-06-05 DIAGNOSIS — M48062 Spinal stenosis, lumbar region with neurogenic claudication: Secondary | ICD-10-CM | POA: Diagnosis not present

## 2023-06-05 DIAGNOSIS — Z01818 Encounter for other preprocedural examination: Secondary | ICD-10-CM | POA: Diagnosis not present

## 2023-06-05 DIAGNOSIS — M48061 Spinal stenosis, lumbar region without neurogenic claudication: Secondary | ICD-10-CM | POA: Diagnosis not present

## 2023-06-05 DIAGNOSIS — E559 Vitamin D deficiency, unspecified: Secondary | ICD-10-CM | POA: Diagnosis not present

## 2023-06-05 DIAGNOSIS — I825Z1 Chronic embolism and thrombosis of unspecified deep veins of right distal lower extremity: Secondary | ICD-10-CM | POA: Diagnosis not present

## 2023-06-05 DIAGNOSIS — M7918 Myalgia, other site: Secondary | ICD-10-CM | POA: Diagnosis not present

## 2023-06-05 DIAGNOSIS — E785 Hyperlipidemia, unspecified: Secondary | ICD-10-CM | POA: Diagnosis not present

## 2023-06-10 ENCOUNTER — Telehealth: Payer: Self-pay

## 2023-06-10 NOTE — Telephone Encounter (Signed)
   Name: Brandon Norman  DOB: [birth year]  MRN: [id number]  Primary Cardiologist: None   Preoperative team, please contact this patient and set up a phone call appointment for further preoperative risk assessment. Please obtain consent and complete medication review. Thank you for your help.  I confirm that guidance regarding antiplatelet and oral anticoagulation therapy has been completed and, if necessary, noted below.  Patient's Xarelto is not prescribed by cardiology.  Recommendations for holding Xarelto will need to come from prescribing provider.  I also confirmed the patient resides in the state of [place]. As per [facility] Medical Board telemedicine laws, the patient must reside in the state in which the provider is licensed.   Ronney Asters, NP 06/10/2023, 4:28 PM [facility]

## 2023-06-10 NOTE — Telephone Encounter (Signed)
   Pre-operative Risk Assessment    Patient Name: Brandon Norman  DOB: 04-09-1956 MRN: [id number]      Request for Surgical Clearance    Procedure:   lumbar laminectomy  Date of Surgery:  Clearance TBD                                 Surgeon:  Dr. Kendell Bane Dawley Surgeon's Group or Practice Name:  [facility] Phone number:  [phone] Fax number:  [phone] attenion Nikki    Type of Clearance Requested:   - Medical    Type of Anesthesia:  General    Additional requests/questions:    Merlene Laughter   06/10/2023, 4:18 PM

## 2023-06-16 NOTE — Telephone Encounter (Signed)
Pt has appt with [name], NP 06/19/23. I will confirm ok with pre op APP to address clearance at appt 06/19/23.  Ok per pre op APP to defer clearance to [name], NP.

## 2023-06-18 NOTE — Progress Notes (Unsigned)
Cardiology Office Note:  .   Date:  06/20/2023  ID:  Brandon Norman, DOB [birth year], MRN [id number] PCP: Paulina Fusi, MD  [facility] Providers Cardiologist:  Garwin Brothers, MD    History of Present Illness: .   Brandon Norman is a 67 y.o. male with a past medical history of pulmonary embolism anticoagulated on Xarelto, DVT, DM 2, thyroid disease, CKD, dyslipidemia.  04/07/2019 echo EF 60 to 65%, no valvular abnormalities  He establish care with Dr. Tomie China in 2018 at the behest of his PCP for evaluation of his hypertension, diabetes, thromboembolism.  Most recently evaluated by Dr. Tomie China on 10/27/2022, was concerned with muscle aches-just had discontinued statin medication but this persisted.   He presents today for preoperative evaluation for upcoming lumbar laminectomy.  He is doing well from a cardiac perspective, most bothered currently by lower back pain and bilateral leg pain. Blood pressure is elevated in the office today, but he states it is typically well controlled at home. He denies chest pain, palpitations, dyspnea, pnd, orthopnea, n, v, dizziness, syncope, edema, weight gain, or early satiety.     ROS: Review of Systems  Musculoskeletal:  Positive for back pain.  All other systems reviewed and are negative.    Studies Reviewed: Marland Kitchen   EKG Interpretation Date/Time:  Friday June 19 2023 09:07:24 EST Ventricular Rate:  89 PR Interval:  182 QRS Duration:  76 QT Interval:  354 QTC Calculation: 430 R Axis:   13  Text Interpretation: Normal sinus rhythm Normal ECG No previous ECGs available Confirmed by [name] [phone]) on 06/19/2023 9:30:33 AM    Cardiac Studies & Procedures       ECHOCARDIOGRAM  ECHOCARDIOGRAM COMPLETE 04/07/2019  Narrative ECHOCARDIOGRAM REPORT    [name] Date of Exam: 04/07/2019 Medical Rec #:  [id number]        Height:       68.0 in Accession #:    [id number]       Weight:       179.0  lb Date of Birth:  08/29/1955         BSA:          1.95 m Patient Age:    63 years         BP:           142/76 mmHg Patient Gender: M                HR:           79 bpm. Exam Location:  [place]  Procedure: 2D Echo  Indications:    Murmur 785.2 / R01.1  History:        Patient has no prior history of Echocardiogram examinations.  Sonographer:    Louie Boston Referring Phys: Rito Ehrlich [facility]  IMPRESSIONS   1. Left ventricular ejection fraction, by visual estimation, is 60 to 65%. The left ventricle has normal function. Normal left ventricular size. There is no left ventricular hypertrophy. 2. Global right ventricle has normal systolic function.The right ventricular size is normal. No increase in right ventricular wall thickness. 3. Left atrial size was normal. 4. Right atrial size was normal. 5. The mitral valve is normal in structure. No evidence of mitral valve regurgitation. No evidence of mitral stenosis. 6. The tricuspid valve is normal in structure. Tricuspid valve regurgitation was not visualized by color flow Doppler. 7. The aortic valve is normal in structure. Aortic valve  regurgitation was not visualized by color flow Doppler. Structurally normal aortic valve, with no evidence of sclerosis or stenosis. 8. The pulmonic valve was normal in structure. Pulmonic valve regurgitation is not visualized by color flow Doppler. 9. The inferior vena cava is normal in size with greater than 50% respiratory variability, suggesting right atrial pressure of 3 mmHg.  FINDINGS Left Ventricle: Left ventricular ejection fraction, by visual estimation, is 60 to 65%. The left ventricle has normal function. There is no left ventricular hypertrophy. Normal left ventricular size.  Right Ventricle: The right ventricular size is normal. No increase in right ventricular wall thickness. Global RV systolic function is has normal systolic function.  Left Atrium: Left atrial size was normal in  size.  Right Atrium: Right atrial size was normal in size  Pericardium: There is no evidence of pericardial effusion.  Mitral Valve: The mitral valve is normal in structure. No evidence of mitral valve stenosis by observation. No evidence of mitral valve regurgitation.  Tricuspid Valve: The tricuspid valve is normal in structure. Tricuspid valve regurgitation was not visualized by color flow Doppler.  Aortic Valve: The aortic valve is normal in structure. Aortic valve regurgitation was not visualized by color flow Doppler. The aortic valve is structurally normal, with no evidence of sclerosis or stenosis.  Pulmonic Valve: The pulmonic valve was normal in structure. Pulmonic valve regurgitation is not visualized by color flow Doppler.  Aorta: The aortic root, ascending aorta and aortic arch are all structurally normal, with no evidence of dilitation or obstruction.  Venous: The inferior vena cava is normal in size with greater than 50% respiratory variability, suggesting right atrial pressure of 3 mmHg.  IAS/Shunts: No atrial level shunt detected by color flow Doppler. No ventricular septal defect is seen or detected. There is no evidence of an atrial septal defect.    LEFT VENTRICLE PLAX 2D LVIDd:         4.30 cm  Diastology LVIDs:         2.60 cm  LV e' lateral:   9.57 cm/s LV PW:         1.00 cm  LV E/e' lateral: 8.5 LV IVS:        1.10 cm  LV e' medial:    7.51 cm/s LVOT diam:     2.00 cm  LV E/e' medial:  10.8 LV SV:         58 ml LV SV Index:   29.38 LVOT Area:     3.14 cm   RIGHT VENTRICLE            IVC RV S prime:     9.14 cm/s  IVC diam: 1.90 cm TAPSE (M-mode): 2.5 cm  LEFT ATRIUM             Index       RIGHT ATRIUM          Index LA diam:        3.70 cm 1.90 cm/m  RA Area:     9.57 cm LA Vol (A2C):   35.2 ml 18.05 ml/m RA Volume:   19.20 ml 9.85 ml/m LA Vol (A4C):   27.4 ml 14.05 ml/m LA Biplane Vol: 31.3 ml 16.05 ml/m AORTIC VALVE LVOT Vmax:   89.40  cm/s LVOT Vmean:  66.300 cm/s LVOT VTI:    0.190 m  AORTA Ao Root diam: 3.40 cm Ao Asc diam:  3.20 cm  MITRAL VALVE MV Area (PHT): 4.17 cm  SHUNTS MV PHT:        52.78 msec           Systemic VTI:  0.19 m MV Decel Time: 182 msec             Systemic Diam: 2.00 cm MV E velocity: 81.00 cm/s 103 cm/s MV A velocity: 77.10 cm/s 70.3 cm/s MV E/A ratio:  1.05       1.5   Gypsy Balsam MD Electronically signed by Gypsy Balsam MD Signature Date/Time: 04/07/2019/1:49:25 PM    Final             Risk Assessment/Calculations:     HYPERTENSION CONTROL Vitals:   06/19/23 0858 06/19/23 0933 06/19/23 0939  BP: (!) 141/79 (!) 158/98 (!) 144/82    The patient's blood pressure is elevated above target today.  In order to address the patient's elevated BP: Blood pressure will be monitored at home to determine if medication changes need to be made.          Physical Exam:   VS:  BP (!) 144/82   Pulse 89   Ht 5' 7.5" (1.715 m)   Wt 181 lb (82.1 kg)   SpO2 95%   BMI 27.93 kg/m    Wt Readings from Last 3 Encounters:  06/19/23 181 lb (82.1 kg)  04/17/23 179 lb (81.2 kg)  10/27/22 182 lb 9.6 oz (82.8 kg)    GEN: Well nourished, well developed in no acute distress NECK: No JVD; No carotid bruits CARDIAC: RRR, no murmurs, rubs, gallops RESPIRATORY:  Clear to auscultation without rales, wheezing or rhonchi  ABDOMEN: Soft, non-tender, non-distended EXTREMITIES:  No edema; No deformity   ASSESSMENT AND PLAN: .   History of PE/DVT -currently anticoagulated on Xarelto, this is managed by his PCP.  Previously, he stopped his Xarelto for lithotripsy and subsequently developed venous thromboembolism therefore, his primary cardiologist Dr. Susette Racer bridging with Lovenox for his upcoming lumbar laminectomy. Will see if our pharmacist can make recommendations for bridging.   Dyslipidemia -continue Zetia, continue fenofibrate--previously intolerant of  statins.  Elevated blood pressure reading-slightly elevated in the office today 141/79, states is typically well-controlled, his wife is a Engineer, civil (consulting) and checks it at home.  For now, we will continue to monitor, have him keep a blood pressure log at home for 2 weeks.  If his blood pressure remains elevated in the future, could consider starting an ACE or an ARB.  DM2-monitored by PCP  Preoperative cardiovascular evaluation- According to the Revised Cardiac Risk Index (RCRI), his Perioperative Risk of Major Cardiac Event is (%): 0.4 His Functional Capacity in METs is: 7.59 according to the Duke Activity Status Index (DASI). Therefore, based on ACC/AHA guidelines, patient would be at acceptable risk for the planned procedure without further cardiovascular testing. I will route this recommendation to the requesting party via Epic fax function.  Regarding his Xarelto, we do not manage this for him however he previously stopped his DOAC for lithotripsy in the past and developed subsequent thromboembolism.  Per recommendations per Dr. Tomie China, plan to bridge with Lovenox.        Dispo:   Signed, Flossie Dibble, NP

## 2023-06-19 ENCOUNTER — Encounter: Payer: Self-pay | Admitting: Cardiology

## 2023-06-19 ENCOUNTER — Ambulatory Visit: Payer: Medicare Other | Attending: Cardiology | Admitting: Cardiology

## 2023-06-19 VITALS — BP 144/82 | HR 89 | Ht 67.5 in | Wt 181.0 lb

## 2023-06-19 DIAGNOSIS — E785 Hyperlipidemia, unspecified: Secondary | ICD-10-CM | POA: Diagnosis present

## 2023-06-19 DIAGNOSIS — Z86711 Personal history of pulmonary embolism: Secondary | ICD-10-CM | POA: Diagnosis present

## 2023-06-19 DIAGNOSIS — R030 Elevated blood-pressure reading, without diagnosis of hypertension: Secondary | ICD-10-CM | POA: Diagnosis present

## 2023-06-19 DIAGNOSIS — E088 Diabetes mellitus due to underlying condition with unspecified complications: Secondary | ICD-10-CM

## 2023-06-19 DIAGNOSIS — M48061 Spinal stenosis, lumbar region without neurogenic claudication: Secondary | ICD-10-CM

## 2023-06-19 DIAGNOSIS — Z86718 Personal history of other venous thrombosis and embolism: Secondary | ICD-10-CM

## 2023-06-19 DIAGNOSIS — Z01818 Encounter for other preprocedural examination: Secondary | ICD-10-CM

## 2023-06-19 HISTORY — DX: Spinal stenosis, lumbar region without neurogenic claudication: M48.061

## 2023-06-19 NOTE — Patient Instructions (Signed)
Please keep a BP log for 2 weeks and send by MyChart or mail.                         Name and DOB__________________________   Blood Pressure Record Sheet To take your blood pressure, you will need a blood pressure machine. You can buy a blood pressure machine (blood pressure monitor) at your clinic, drug store, or online. When choosing one, consider: An automatic monitor that has an arm cuff. A cuff that wraps snugly around your upper arm. You should be able to fit only one finger between your arm and the cuff. A device that stores blood pressure reading results. Do not choose a monitor that measures your blood pressure from your wrist or finger. Follow your health care provider's instructions for how to take your blood pressure. To use this form: Get one reading in the morning (a.m.) 1-2 hours after you take any medicines. Get one reading in the evening (p.m.) before supper.   Blood pressure log Date: _______________________  a.m. _____________________(1st reading) HR___________            p.m. _____________________(2nd reading) HR__________  Date: _______________________  a.m. _____________________(1st reading) HR___________            p.m. _____________________(2nd reading) HR__________  Date: _______________________  a.m. _____________________(1st reading) HR___________            p.m. _____________________(2nd reading) HR__________  Date: _______________________  a.m. _____________________(1st reading) HR___________            p.m. _____________________(2nd reading) HR__________  Date: _______________________  a.m. _____________________(1st reading) HR___________            p.m. _____________________(2nd reading) HR__________  Date: _______________________  a.m. _____________________(1st reading) HR___________            p.m. _____________________(2nd reading) HR__________  Date: _______________________  a.m. _____________________(1st reading)  HR___________            p.m. _____________________(2nd reading) HR__________   This information is not intended to replace advice given to you by your health care provider. Make sure you discuss any questions you have with your health care provider. Document Revised: 10/19/2019 Document Reviewed: 10/19/2019 Elsevier Patient Education  2021 Elsevier Inc.  Medication Instructions:  Your physician recommends that you continue on your current medications as directed. Please refer to the Current Medication list given to you today.  *If you need a refill on your cardiac medications before your next appointment, please call your pharmacy* Lab Work: None Ordered If you have labs (blood work) drawn today and your tests are completely normal, you will receive your results only by: MyChart Message (if you have MyChart) OR A paper copy in the mail If you have any lab test that is abnormal or we need to change your treatment, we will call you to review the results.   Testing/Procedures: None Ordered   Follow-Up: At [facility], you and your health needs are our priority.  As part of our continuing mission to provide you with exceptional heart care, we have created designated Provider Care Teams.  These Care Teams include your primary Cardiologist (physician) and Advanced Practice Providers (APPs -  Physician Assistants and Nurse Practitioners) who all work together to provide you with the care you need, when you need it.  We recommend signing up for the patient portal called "MyChart".  Sign up information is provided on this After Visit Summary.  MyChart is used to connect with patients  for Virtual Visits (Telemedicine).  Patients are able to view lab/test results, encounter notes, upcoming appointments, etc.  Non-urgent messages can be sent to your provider as well.   To learn more about what you can do with MyChart, go to [email or web address].    Your next appointment:   9 month(s) follow  up with Dr. Tomie China

## 2023-06-19 NOTE — Telephone Encounter (Signed)
Per [name]:  We do not manage his Xarelto, however his primary cardiologist recommends bridging with Lovenox as the last time he stopped for lithotripsy he developed a recurrent PE.    I spoke with Dr. Tomie China, he would like to see if pharmacy can assist with bridging with Lovenox.   Will send to pharmD.

## 2023-06-22 NOTE — Telephone Encounter (Signed)
Spoke with patient. He has done lovenox previous. He will let us know when surgery is scheduled and we can provide him instructions via mychart once we have a date.

## 2023-06-24 ENCOUNTER — Other Ambulatory Visit: Payer: Self-pay | Admitting: Neurological Surgery

## 2023-06-25 ENCOUNTER — Encounter: Payer: Self-pay | Admitting: Cardiology

## 2023-06-25 NOTE — Telephone Encounter (Signed)
Pt is on Xarelto. Will forward to PharmD to discuss Lovenox bridging with pt for upcoming procedure.

## 2023-06-26 MED ORDER — ENOXAPARIN SODIUM 80 MG/0.8ML IJ SOSY: 80.0000 mg | PREFILLED_SYRINGE | Freq: Two times a day (BID) | INTRAMUSCULAR | 0 refills | Status: DC

## 2023-06-29 ENCOUNTER — Other Ambulatory Visit: Payer: Self-pay | Admitting: Cardiology

## 2023-06-29 DIAGNOSIS — Z86718 Personal history of other venous thrombosis and embolism: Secondary | ICD-10-CM

## 2023-06-29 MED ORDER — ENOXAPARIN SODIUM 80 MG/0.8ML IJ SOSY: 80.0000 mg | PREFILLED_SYRINGE | Freq: Two times a day (BID) | INTRAMUSCULAR | 0 refills | Status: DC

## 2023-06-29 NOTE — Addendum Note (Signed)
Addended by: Malena Peer D on: 06/29/2023 11:34 AM   Modules accepted: Orders

## 2023-06-30 ENCOUNTER — Telehealth: Payer: Self-pay | Admitting: Cardiology

## 2023-06-30 NOTE — Telephone Encounter (Signed)
Resent updated Lovenox Rx earlier today

## 2023-06-30 NOTE — Telephone Encounter (Signed)
I called [facility] and explained that patient needs 5 syringes. They dispensed 4 to him. When the rx was transferred it was not transferred correctly and he was only given 4 syringes (rx was written for 4ml which is 5 syringes). He needs 1 more syringe. Pharmacy said they will fill the extra syringe.

## 2023-06-30 NOTE — Telephone Encounter (Signed)
Pt c/o medication issue:  1. Name of Medication:   enoxaparin (LOVENOX) 80 MG/0.8ML injection    2. How are you currently taking this medication (dosage and times per day)? Not sure  3. Are you having a reaction (difficulty breathing--STAT)? no  4. What is your medication issue? Pharmacy needs to know dosage and how to take it

## 2023-07-31 NOTE — Pre-Procedure Instructions (Signed)
Surgical Instructions   Your procedure is scheduled on August 07, 2023. Report to [facility] Main Entrance "A" at 10:30 A.M., then check in with the Admitting office. Any questions or running late day of surgery: call [phone]  Questions prior to your surgery date: call [phone], Monday-Friday, 8am-4pm. If you experience any cold or flu symptoms such as cough, fever, chills, shortness of breath, etc. between now and your scheduled surgery, please notify us at the above number.     Remember:  Do not eat or drink after midnight the night before your surgery    Take these medicines the morning of surgery with A SIP OF WATER: ezetimibe (ZETIA)  levothyroxine (SYNTHROID)    May take these medicines IF NEEDED: acetaminophen (TYLENOL)  traMADol (ULTRAM)    Please follow your prescriber's instructions on stopping your rivaroxaban (XARELTO) and starting enoxaparin (LOVENOX) injections.    One week prior to surgery, STOP taking any Aspirin (unless otherwise instructed by your surgeon) Aleve, Naproxen, Ibuprofen, Motrin, Advil, Goody's, BC's, all herbal medications, fish oil, and non-prescription vitamins.   WHAT DO I DO ABOUT MY DIABETES MEDICATION?   Do not take glimepiride (AMARYL) the evening before surgery or the morning of surgery.  STOP taking your FARXIGA three days prior to surgery. Your last dose will be January 20th.      STOP taking your TRULICITY one week prior to surgery. DO NOT take any doses after January 16th.   HOW TO MANAGE YOUR DIABETES BEFORE AND AFTER SURGERY  Why is it important to control my blood sugar before and after surgery? Improving blood sugar levels before and after surgery helps healing and can limit problems. A way of improving blood sugar control is eating a healthy diet by:  Eating less sugar and carbohydrates  Increasing activity/exercise  Talking with your doctor about reaching your blood sugar goals High blood sugars (greater than  180 mg/dL) can raise your risk of infections and slow your recovery, so you will need to focus on controlling your diabetes during the weeks before surgery. Make sure that the doctor who takes care of your diabetes knows about your planned surgery including the date and location.  How do I manage my blood sugar before surgery? Check your blood sugar at least 4 times a day, starting 2 days before surgery, to make sure that the level is not too high or low.  Check your blood sugar the morning of your surgery when you wake up and every 2 hours until you get to the Short Stay unit.  If your blood sugar is less than 70 mg/dL, you will need to treat for low blood sugar: Do not take insulin. Treat a low blood sugar (less than 70 mg/dL) with  cup of clear juice (cranberry or apple), 4 glucose tablets, OR glucose gel. Recheck blood sugar in 15 minutes after treatment (to make sure it is greater than 70 mg/dL). If your blood sugar is not greater than 70 mg/dL on recheck, call [phone] for further instructions. Report your blood sugar to the short stay nurse when you get to Short Stay.  If you are admitted to the hospital after surgery: Your blood sugar will be checked by the staff and you will probably be given insulin after surgery (instead of oral diabetes medicines) to make sure you have good blood sugar levels. The goal for blood sugar control after surgery is 80-180 mg/dL.  Do NOT Smoke (Tobacco/Vaping) for 24 hours prior to your procedure.  If you use a CPAP at night, you may bring your mask/headgear for your overnight stay.   You will be asked to remove any contacts, glasses, piercing's, hearing aid's, dentures/partials prior to surgery. Please bring cases for these items if needed.    Patients discharged the day of surgery will not be allowed to drive home, and someone needs to stay with them for 24 hours.  SURGICAL WAITING ROOM VISITATION Patients may have no more  than 2 support people in the waiting area - these visitors may rotate.   Pre-op nurse will coordinate an appropriate time for 1 ADULT support person, who may not rotate, to accompany patient in pre-op.  Children under the age of 73 must have an adult with them who is not the patient and must remain in the main waiting area with an adult.  If the patient needs to stay at the hospital during part of their recovery, the visitor guidelines for inpatient rooms apply.  Please refer to the [facility] website for the visitor guidelines for any additional information.   If you received a COVID test during your pre-op visit  it is requested that you wear a mask when out in public, stay away from anyone that may not be feeling well and notify your surgeon if you develop symptoms. If you have been in contact with anyone that has tested positive in the last 10 days please notify you surgeon.      Pre-operative 5 CHG Bathing Instructions   You can play a key role in reducing the risk of infection after surgery. Your skin needs to be as free of germs as possible. You can reduce the number of germs on your skin by washing with CHG (chlorhexidine gluconate) soap before surgery. CHG is an antiseptic soap that kills germs and continues to kill germs even after washing.   DO NOT use if you have an allergy to chlorhexidine/CHG or antibacterial soaps. If your skin becomes reddened or irritated, stop using the CHG and notify one of our RNs at [phone].   Please shower with the CHG soap starting 4 days before surgery using the following schedule:     Please keep in mind the following:  DO NOT shave, including legs and underarms, starting the day of your first shower.   You may shave your face at any point before/day of surgery.  Place clean sheets on your bed the day you start using CHG soap. Use a clean washcloth (not used since being washed) for each shower. DO NOT sleep with pets once you start using  the CHG.   CHG Shower Instructions:  Wash your face and private area with normal soap. If you choose to wash your hair, wash first with your normal shampoo.  After you use shampoo/soap, rinse your hair and body thoroughly to remove shampoo/soap residue.  Turn the water OFF and apply about 3 tablespoons (45 ml) of CHG soap to a CLEAN washcloth.  Apply CHG soap ONLY FROM YOUR NECK DOWN TO YOUR TOES (washing for 3-5 minutes)  DO NOT use CHG soap on face, private areas, open wounds, or sores.  Pay special attention to the area where your surgery is being performed.  If you are having back surgery, having someone wash your back for you may be helpful. Wait 2 minutes after CHG soap is applied, then you may rinse off the CHG soap.  Pat dry with a  clean towel  Put on clean clothes/pajamas   If you choose to wear lotion, please use ONLY the CHG-compatible lotions that are listed below.  Additional instructions for the day of surgery: DO NOT APPLY any lotions, deodorants, cologne, or perfumes.   Do not bring valuables to the hospital. [facility] is not responsible for any belongings/valuables. Do not wear nail polish, gel polish, artificial nails, or any other type of covering on natural nails (fingers and toes) Do not wear jewelry or makeup Put on clean/comfortable clothes.  Please brush your teeth.  Ask your nurse before applying any prescription medications to the skin.     CHG Compatible Lotions   Aveeno Moisturizing lotion  Cetaphil Moisturizing Cream  Cetaphil Moisturizing Lotion  Clairol Herbal Essence Moisturizing Lotion, Dry Skin  Clairol Herbal Essence Moisturizing Lotion, Extra Dry Skin  Clairol Herbal Essence Moisturizing Lotion, Normal Skin  Curel Age Defying Therapeutic Moisturizing Lotion with Alpha Hydroxy  Curel Extreme Care Body Lotion  Curel Soothing Hands Moisturizing Hand Lotion  Curel Therapeutic Moisturizing Cream, Fragrance-Free  Curel Therapeutic Moisturizing  Lotion, Fragrance-Free  Curel Therapeutic Moisturizing Lotion, Original Formula  Eucerin Daily Replenishing Lotion  Eucerin Dry Skin Therapy Plus Alpha Hydroxy Crme  Eucerin Dry Skin Therapy Plus Alpha Hydroxy Lotion  Eucerin Original Crme  Eucerin Original Lotion  Eucerin Plus Crme Eucerin Plus Lotion  Eucerin TriLipid Replenishing Lotion  Keri Anti-Bacterial Hand Lotion  Keri Deep Conditioning Original Lotion Dry Skin Formula Softly Scented  Keri Deep Conditioning Original Lotion, Fragrance Free Sensitive Skin Formula  Keri Lotion Fast Absorbing Fragrance Free Sensitive Skin Formula  Keri Lotion Fast Absorbing Softly Scented Dry Skin Formula  Keri Original Lotion  Keri Skin Renewal Lotion Keri Silky Smooth Lotion  Keri Silky Smooth Sensitive Skin Lotion  Nivea Body Creamy Conditioning Oil  Nivea Body Extra Enriched Lotion  Nivea Body Original Lotion  Nivea Body Sheer Moisturizing Lotion Nivea Crme  Nivea Skin Firming Lotion  NutraDerm 30 Skin Lotion  NutraDerm Skin Lotion  NutraDerm Therapeutic Skin Cream  NutraDerm Therapeutic Skin Lotion  ProShield Protective Hand Cream  Provon moisturizing lotion  Please read over the following fact sheets that you were given.

## 2023-08-03 ENCOUNTER — Other Ambulatory Visit: Payer: Self-pay

## 2023-08-03 ENCOUNTER — Encounter (HOSPITAL_COMMUNITY)
Admission: RE | Admit: 2023-08-03 | Discharge: 2023-08-03 | Disposition: A | Discharge status: Home / Self Care | Payer: Medicare Other | Source: Ambulatory Visit | Attending: Neurological Surgery

## 2023-08-03 ENCOUNTER — Encounter (HOSPITAL_COMMUNITY): Payer: Self-pay

## 2023-08-03 VITALS — BP 125/87 | HR 93 | Temp 98.2°F | Resp 18 | Ht 68.0 in | Wt 180.0 lb

## 2023-08-03 DIAGNOSIS — E119 Type 2 diabetes mellitus without complications: Secondary | ICD-10-CM | POA: Diagnosis not present

## 2023-08-03 DIAGNOSIS — Z01818 Encounter for other preprocedural examination: Secondary | ICD-10-CM

## 2023-08-03 DIAGNOSIS — Z01812 Encounter for preprocedural laboratory examination: Secondary | ICD-10-CM | POA: Diagnosis not present

## 2023-08-03 HISTORY — DX: Dyspnea, unspecified: R06.00

## 2023-08-03 HISTORY — DX: Personal history of urinary calculi: Z87.442

## 2023-08-03 HISTORY — DX: Hypothyroidism, unspecified: E03.9

## 2023-08-03 HISTORY — DX: Unspecified osteoarthritis, unspecified site: M19.90

## 2023-08-03 HISTORY — DX: Essential (primary) hypertension: I10

## 2023-08-03 HISTORY — DX: Peripheral vascular disease, unspecified: I73.9

## 2023-08-03 LAB — CBC
HCT: 53.9 % — ABNORMAL HIGH (ref 39.0–52.0)
Hemoglobin: 18.2 g/dL — ABNORMAL HIGH (ref 13.0–17.0)
MCH: 32.2 pg (ref 26.0–34.0)
MCHC: 33.8 g/dL (ref 30.0–36.0)
MCV: 95.4 fL (ref 80.0–100.0)
Platelets: 271 10*3/uL (ref 150–400)
RBC: 5.65 MIL/uL (ref 4.22–5.81)
RDW: 13.2 % (ref 11.5–15.5)
WBC: 5.2 10*3/uL (ref 4.0–10.5)
nRBC: 0 % (ref 0.0–0.2)

## 2023-08-03 LAB — GLUCOSE, CAPILLARY: Glucose-Capillary: 156 mg/dL — ABNORMAL HIGH (ref 70–99)

## 2023-08-03 LAB — BASIC METABOLIC PANEL
Anion gap: 9 (ref 5–15)
BUN: 21 mg/dL (ref 8–23)
CO2: 25 mmol/L (ref 22–32)
Calcium: 9.6 mg/dL (ref 8.9–10.3)
Chloride: 106 mmol/L (ref 98–111)
Creatinine, Ser: 1.63 mg/dL — ABNORMAL HIGH (ref 0.61–1.24)
GFR, Estimated: 45 mL/min — ABNORMAL LOW (ref >60)
Glucose, Bld: 142 mg/dL — ABNORMAL HIGH (ref 70–99)
Potassium: 4.6 mmol/L (ref 3.5–5.1)
Sodium: 140 mmol/L (ref 135–145)

## 2023-08-03 LAB — HEMOGLOBIN A1C
Hgb A1c MFr Bld: 6.8 % — ABNORMAL HIGH (ref 4.8–5.6)
Mean Plasma Glucose: 148.46 mg/dL

## 2023-08-03 LAB — SURGICAL PCR SCREEN
MRSA, PCR: NEGATIVE
Staphylococcus aureus: NEGATIVE

## 2023-08-03 NOTE — Progress Notes (Signed)
PCP - Dr. Dalbert Garnet Cardiologist - Dr. Belva Crome - last office visit 06/19/2023 Nephrologist - Recently established with someone new at [facility]  PPM/ICD - Denies Device Orders - n/a Rep Notified - n/a  Chest x-ray - n/a EKG - 06/19/2023 Stress Test - Per pt, many years ago ECHO - 04/07/2019 Cardiac Cath - Denies  Sleep Study - Denies CPAP - n/a  Pt is DM2. He checks his blood sugar every morning. Normal fasting range is 100-120s. CBG at pre-op appointment 156. A1c result pending  Last dose of GLP1 agonist- Last dose of Trulicity 07/26/23 GLP1 instructions: Pt has already held his next dose in preparation for surgery and instructed to not take any additional doses. GLP2 - Pt instructed to hold Farxiga three days prior to surgery. Last dose will be today, January 20th.  Blood Thinner Instructions: Last dose of Xarelto January 20th. He will start his Lovenox bridge tomorrow, January 21st (instructions provided by Cardiology even though PCP usually manages) Aspirin Instructions: n/a  NPO after midnight   COVID TEST- n/a   Anesthesia review: Yes. Cardiac clearance (HTN, DVT/PE on blood thinners), DM2 and CKD3   Patient denies shortness of breath, fever, cough and chest pain at PAT appointment. Pt denies any respiratory illness/infection in the last two months.    All instructions explained to the patient, with a verbal understanding of the material. Patient agrees to go over the instructions while at home for a better understanding. Patient also instructed to self quarantine after being tested for COVID-19. The opportunity to ask questions was provided.

## 2023-08-04 NOTE — Progress Notes (Signed)
Anesthesia Chart Review:  68 yo male follows with cardiology for hx of DVT/PE anticoagulated on Xarelto, DVT, HLD, HTN. Echo 03/2019 EF 60 to 65%, no valvular abnormalities. Last seen by Brandon Norman 06/19/23 for preop eval. Per note, "Preoperative cardiovascular evaluation- According to the Revised Cardiac Risk Index (RCRI), his Perioperative Risk of Major Cardiac Event is (%): 0.4 His Functional Capacity in METs is: 7.59 according to the Duke Activity Status Index (DASI). Therefore, based on ACC/AHA guidelines, patient would be at acceptable risk for the planned procedure without further cardiovascular testing. I will route this recommendation to the requesting party via Epic fax function.  Regarding his Xarelto, we do not manage this for him however he previously stopped his DOAC for lithotripsy in the past and developed subsequent thromboembolism.  Per recommendations per Dr. Tomie China, plan to bridge with Lovenox."  Pt on Lovenox bridge at the direction of cardiology PharmD. LD Xarelto 08/03/23.   Other pertinent hx includes NIDDM 2, hypothyroid, CKD 3.   Pt reports LD Trulicity 07/26/23.  Preop labs reviewed, creatinine elevated 1.63 c/w hx of CKD, polycythemia with Hgb 18.2, A1c 6.8.  EKG 06/19/23: NSR. Rate 89.   TTE 04/07/19:  1. Left ventricular ejection fraction, by visual estimation, is 60 to  65%. The left ventricle has normal function. Normal left ventricular size.  There is no left ventricular hypertrophy.   2. Global right ventricle has normal systolic function.The right  ventricular size is normal. No increase in right ventricular wall  thickness.   3. Left atrial size was normal.   4. Right atrial size was normal.   5. The mitral valve is normal in structure. No evidence of mitral valve  regurgitation. No evidence of mitral stenosis.   6. The tricuspid valve is normal in structure. Tricuspid valve  regurgitation was not visualized by color flow Doppler.   7. The aortic valve  is normal in structure. Aortic valve regurgitation  was not visualized by color flow Doppler. Structurally normal aortic  valve, with no evidence of sclerosis or stenosis.   8. The pulmonic valve was normal in structure. Pulmonic valve  regurgitation is not visualized by color flow Doppler.   9. The inferior vena cava is normal in size with greater than 50%  respiratory variability, suggesting right atrial pressure of 3 mmHg.     Zannie Cove [facility] Short Stay Center/Anesthesiology Phone [phone] 08/04/2023 3:53 PM

## 2023-08-04 NOTE — Anesthesia Preprocedure Evaluation (Addendum)
Anesthesia Evaluation  Patient identified by MRN, date of birth, ID band Patient awake    Reviewed: Allergy & Precautions, NPO status , Patient's Chart, lab work & pertinent test results  Airway Mallampati: II  TM Distance: >3 FB Neck ROM: Full    Dental no notable dental hx.    Pulmonary neg pulmonary ROS   Pulmonary exam normal        Cardiovascular hypertension, + Peripheral Vascular Disease and + DVT   Rhythm:Regular Rate:Normal     Neuro/Psych negative neurological ROS  negative psych ROS   GI/Hepatic negative GI ROS, Neg liver ROS,,,  Endo/Other  diabetes, Type 2, Oral Hypoglycemic AgentsHypothyroidism    Renal/GU CRFRenal disease  negative genitourinary   Musculoskeletal  (+) Arthritis , Osteoarthritis,    Abdominal Normal abdominal exam  (+)   Peds  Hematology Lab Results      Component                Value               Date                      WBC                      5.2                 08/03/2023                HGB                      18.2 (H)            08/03/2023                HCT                      53.9 (H)            08/03/2023                MCV                      95.4                08/03/2023                PLT                      271                 08/03/2023              Anesthesia Other Findings   Reproductive/Obstetrics                             Anesthesia Physical Anesthesia Plan  ASA: 3  Anesthesia Plan: General   Post-op Pain Management: Celebrex PO (pre-op)* and Tylenol PO (pre-op)*   Induction: Intravenous  PONV Risk Score and Plan: 2 and Ondansetron, Dexamethasone and Treatment may vary due to age or medical condition  Airway Management Planned: Mask and Oral ETT  Additional Equipment: None  Intra-op Plan:   Post-operative Plan: Extubation in OR  Informed Consent: I have reviewed the patients History and Physical, chart, labs and  discussed the procedure including the risks, benefits and alternatives for the proposed anesthesia  with the patient or authorized representative who has indicated his/her understanding and acceptance.     Dental advisory given  Plan Discussed with: CRNA  Anesthesia Plan Comments: (PAT note by Antionette Poles, PA-C: 68 yo male follows with cardiology for hx of DVT/PE anticoagulated on Xarelto, DVT, HLD, HTN. Echo 03/2019 EF 60 to 65%, no valvular abnormalities. Last seen by [name] 06/19/23 for preop eval. Per note, "Preoperative cardiovascular evaluation- According to the Revised Cardiac Risk Index (RCRI), his Perioperative Risk of Major Cardiac Event is (%): 0.4 His Functional Capacity in METs is: 7.59 according to the Duke Activity Status Index (DASI). Therefore, based on ACC/AHA guidelines, patient would be at acceptable risk for the planned procedure without further cardiovascular testing. I will route this recommendation to the requesting party via Epic fax function.  Regarding his Xarelto, we do not manage this for him however he previously stopped his DOAC for lithotripsy in the past and developed subsequent thromboembolism.  Per recommendations per Dr. Tomie China, plan to bridge with Lovenox."  Pt on Lovenox bridge at the direction of cardiology PharmD. LD Xarelto 08/03/23.   Other pertinent hx includes NIDDM 2, hypothyroid, CKD 3.  Pt reports LD Trulicity 07/26/23.  Preop labs reviewed, creatinine elevated 1.63 c/w hx of CKD, polycythemia with Hgb 18.2, A1c 6.8.  EKG 06/19/23: NSR. Rate 89.   TTE 04/07/19: 1. Left ventricular ejection fraction, by visual estimation, is 60 to  65%. The left ventricle has normal function. Normal left ventricular size.  There is no left ventricular hypertrophy.  2. Global right ventricle has normal systolic function.The right  ventricular size is normal. No increase in right ventricular wall  thickness.  3. Left atrial size was normal.  4. Right  atrial size was normal.  5. The mitral valve is normal in structure. No evidence of mitral valve  regurgitation. No evidence of mitral stenosis.  6. The tricuspid valve is normal in structure. Tricuspid valve  regurgitation was not visualized by color flow Doppler.  7. The aortic valve is normal in structure. Aortic valve regurgitation  was not visualized by color flow Doppler. Structurally normal aortic  valve, with no evidence of sclerosis or stenosis.  8. The pulmonic valve was normal in structure. Pulmonic valve  regurgitation is not visualized by color flow Doppler.  9. The inferior vena cava is normal in size with greater than 50%  respiratory variability, suggesting right atrial pressure of 3 mmHg.   )        Anesthesia Quick Evaluation

## 2023-08-07 ENCOUNTER — Ambulatory Visit (HOSPITAL_BASED_OUTPATIENT_CLINIC_OR_DEPARTMENT_OTHER): Payer: Medicare Other

## 2023-08-07 ENCOUNTER — Encounter (HOSPITAL_COMMUNITY)
Admission: RE | Disposition: A | Discharge status: Home / Self Care | Payer: Self-pay | Source: Home / Self Care | Attending: Neurological Surgery

## 2023-08-07 ENCOUNTER — Other Ambulatory Visit: Payer: Self-pay

## 2023-08-07 ENCOUNTER — Observation Stay (HOSPITAL_COMMUNITY)
Admission: RE | Admit: 2023-08-07 | Discharge: 2023-08-08 | Disposition: A | Discharge status: Home / Self Care | Payer: Medicare Other | Attending: Neurological Surgery | Admitting: Neurological Surgery

## 2023-08-07 ENCOUNTER — Ambulatory Visit (HOSPITAL_COMMUNITY): Payer: Medicare Other | Admitting: Physician Assistant

## 2023-08-07 ENCOUNTER — Encounter (HOSPITAL_COMMUNITY): Payer: Self-pay | Admitting: Neurological Surgery

## 2023-08-07 ENCOUNTER — Ambulatory Visit (HOSPITAL_COMMUNITY): Payer: Medicare Other

## 2023-08-07 DIAGNOSIS — E039 Hypothyroidism, unspecified: Secondary | ICD-10-CM | POA: Insufficient documentation

## 2023-08-07 DIAGNOSIS — Z79899 Other long term (current) drug therapy: Secondary | ICD-10-CM | POA: Diagnosis not present

## 2023-08-07 DIAGNOSIS — E1122 Type 2 diabetes mellitus with diabetic chronic kidney disease: Secondary | ICD-10-CM | POA: Diagnosis not present

## 2023-08-07 DIAGNOSIS — Z85828 Personal history of other malignant neoplasm of skin: Secondary | ICD-10-CM | POA: Insufficient documentation

## 2023-08-07 DIAGNOSIS — M48061 Spinal stenosis, lumbar region without neurogenic claudication: Principal | ICD-10-CM | POA: Diagnosis present

## 2023-08-07 DIAGNOSIS — Z7985 Long-term (current) use of injectable non-insulin antidiabetic drugs: Secondary | ICD-10-CM | POA: Diagnosis not present

## 2023-08-07 DIAGNOSIS — M48062 Spinal stenosis, lumbar region with neurogenic claudication: Secondary | ICD-10-CM

## 2023-08-07 DIAGNOSIS — E119 Type 2 diabetes mellitus without complications: Secondary | ICD-10-CM

## 2023-08-07 DIAGNOSIS — I129 Hypertensive chronic kidney disease with stage 1 through stage 4 chronic kidney disease, or unspecified chronic kidney disease: Secondary | ICD-10-CM | POA: Diagnosis not present

## 2023-08-07 DIAGNOSIS — N183 Chronic kidney disease, stage 3 unspecified: Secondary | ICD-10-CM | POA: Diagnosis not present

## 2023-08-07 DIAGNOSIS — Z86718 Personal history of other venous thrombosis and embolism: Secondary | ICD-10-CM | POA: Insufficient documentation

## 2023-08-07 DIAGNOSIS — Z7901 Long term (current) use of anticoagulants: Secondary | ICD-10-CM | POA: Insufficient documentation

## 2023-08-07 DIAGNOSIS — Z7722 Contact with and (suspected) exposure to environmental tobacco smoke (acute) (chronic): Secondary | ICD-10-CM | POA: Diagnosis not present

## 2023-08-07 DIAGNOSIS — N189 Chronic kidney disease, unspecified: Secondary | ICD-10-CM | POA: Diagnosis not present

## 2023-08-07 DIAGNOSIS — Z86711 Personal history of pulmonary embolism: Secondary | ICD-10-CM | POA: Diagnosis not present

## 2023-08-07 HISTORY — DX: Spinal stenosis, lumbar region without neurogenic claudication: M48.061

## 2023-08-07 HISTORY — PX: LUMBAR LAMINECTOMY/DECOMPRESSION MICRODISCECTOMY: SHX5026

## 2023-08-07 LAB — GLUCOSE, CAPILLARY
Glucose-Capillary: 134 mg/dL — ABNORMAL HIGH (ref 70–99)
Glucose-Capillary: 127 mg/dL — ABNORMAL HIGH (ref 70–99)
Glucose-Capillary: 123 mg/dL — ABNORMAL HIGH (ref 70–99)
Glucose-Capillary: 287 mg/dL — ABNORMAL HIGH (ref 70–99)
Glucose-Capillary: 213 mg/dL — ABNORMAL HIGH (ref 70–99)

## 2023-08-07 SURGERY — LUMBAR LAMINECTOMY/DECOMPRESSION MICRODISCECTOMY 1 LEVEL
Anesthesia: General | Site: Spine Lumbar

## 2023-08-07 MED ORDER — LIDOCAINE-EPINEPHRINE 1 %-1:100000 IJ SOLN
INTRAMUSCULAR | Status: DC | PRN
  Administered 2023-08-07: 3.5 mL

## 2023-08-07 MED ORDER — POLYETHYLENE GLYCOL 3350 17 G PO PACK: 17.0000 g | PACK | Freq: Every day | ORAL | Status: DC | PRN

## 2023-08-07 MED ORDER — FENOFIBRATE 160 MG PO TABS
160.0000 mg | ORAL_TABLET | Freq: Every evening | ORAL | Status: DC
Start: 2023-08-07 — End: 2023-08-08
  Administered 2023-08-07: 160 mg via ORAL
  Filled 2023-08-07: qty 1

## 2023-08-07 MED ORDER — ACETAMINOPHEN 325 MG PO TABS
650.0000 mg | ORAL_TABLET | ORAL | Status: DC | PRN
  Administered 2023-08-08: 650 mg via ORAL
  Filled 2023-08-07: qty 2

## 2023-08-07 MED ORDER — KETOROLAC TROMETHAMINE 15 MG/ML IJ SOLN
7.5000 mg | Freq: Four times a day (QID) | INTRAMUSCULAR | Status: DC
  Administered 2023-08-07 – 2023-08-08 (×3): 7.5 mg via INTRAVENOUS
  Filled 2023-08-07 (×3): qty 1

## 2023-08-07 MED ORDER — ORAL CARE MOUTH RINSE
15.0000 mL | Freq: Once | OROMUCOSAL | Status: AC
Start: 2023-08-07 — End: 2023-08-07

## 2023-08-07 MED ORDER — CELECOXIB 200 MG PO CAPS
200.0000 mg | ORAL_CAPSULE | Freq: Once | ORAL | Status: DC
  Filled 2023-08-07: qty 1

## 2023-08-07 MED ORDER — CHLORHEXIDINE GLUCONATE CLOTH 2 % EX PADS: 6.0000 | MEDICATED_PAD | Freq: Once | CUTANEOUS | Status: DC

## 2023-08-07 MED ORDER — PHENOL 1.4 % MT LIQD: 1.0000 | OROMUCOSAL | Status: DC | PRN

## 2023-08-07 MED ORDER — CEFAZOLIN SODIUM-DEXTROSE 2-4 GM/100ML-% IV SOLN
2.0000 g | Freq: Four times a day (QID) | INTRAVENOUS | Status: AC
  Administered 2023-08-07 (×2): 2 g via INTRAVENOUS
  Filled 2023-08-07 (×2): qty 100

## 2023-08-07 MED ORDER — SUGAMMADEX SODIUM 200 MG/2ML IV SOLN
INTRAVENOUS | Status: DC | PRN
  Administered 2023-08-07: 200 mg via INTRAVENOUS

## 2023-08-07 MED ORDER — DEXAMETHASONE SODIUM PHOSPHATE 10 MG/ML IJ SOLN
INTRAMUSCULAR | Status: DC | PRN
  Administered 2023-08-07: 4 mg via INTRAVENOUS

## 2023-08-07 MED ORDER — CHLORHEXIDINE GLUCONATE 0.12 % MT SOLN: 15.0000 mL | Freq: Once | OROMUCOSAL | Status: AC

## 2023-08-07 MED ORDER — THROMBIN 5000 UNITS EX SOLR: OROMUCOSAL | Status: DC | PRN

## 2023-08-07 MED ORDER — MIDAZOLAM HCL 2 MG/2ML IJ SOLN
INTRAMUSCULAR | Status: AC
  Filled 2023-08-07: qty 2

## 2023-08-07 MED ORDER — FENTANYL CITRATE (PF) 250 MCG/5ML IJ SOLN
INTRAMUSCULAR | Status: DC | PRN
  Administered 2023-08-07: 100 ug via INTRAVENOUS
  Administered 2023-08-07 (×3): 50 ug via INTRAVENOUS

## 2023-08-07 MED ORDER — ACETAMINOPHEN 500 MG PO TABS
1000.0000 mg | ORAL_TABLET | Freq: Once | ORAL | Status: AC
  Administered 2023-08-07: 1000 mg via ORAL
  Filled 2023-08-07: qty 2

## 2023-08-07 MED ORDER — FENTANYL CITRATE (PF) 100 MCG/2ML IJ SOLN
INTRAMUSCULAR | Status: DC | PRN
  Administered 2023-08-07: 50 ug via INTRAVENOUS

## 2023-08-07 MED ORDER — METHYLPREDNISOLONE ACETATE 80 MG/ML IJ SUSP
INTRAMUSCULAR | Status: DC | PRN
  Administered 2023-08-07: 40 mL

## 2023-08-07 MED ORDER — BUPIVACAINE-EPINEPHRINE (PF) 0.5% -1:200000 IJ SOLN
INTRAMUSCULAR | Status: DC | PRN
  Administered 2023-08-07: 3.5 mL via PERINEURAL

## 2023-08-07 MED ORDER — FENTANYL CITRATE (PF) 100 MCG/2ML IJ SOLN
INTRAMUSCULAR | Status: AC
  Filled 2023-08-07: qty 2

## 2023-08-07 MED ORDER — METHYLPREDNISOLONE ACETATE 80 MG/ML IJ SUSP
INTRAMUSCULAR | Status: AC
  Filled 2023-08-07: qty 1

## 2023-08-07 MED ORDER — SODIUM CHLORIDE 0.9% FLUSH
3.0000 mL | Freq: Two times a day (BID) | INTRAVENOUS | Status: DC
  Administered 2023-08-07: 3 mL via INTRAVENOUS

## 2023-08-07 MED ORDER — MICROFIBRILLAR COLL HEMOSTAT EX POWD
CUTANEOUS | Status: AC
  Filled 2023-08-07: qty 1

## 2023-08-07 MED ORDER — EZETIMIBE 10 MG PO TABS
10.0000 mg | ORAL_TABLET | Freq: Every day | ORAL | Status: DC
  Administered 2023-08-07: 10 mg via ORAL
  Filled 2023-08-07 (×2): qty 1

## 2023-08-07 MED ORDER — HYDROCODONE-ACETAMINOPHEN 5-325 MG PO TABS: 2.0000 | ORAL_TABLET | ORAL | Status: DC | PRN

## 2023-08-07 MED ORDER — ONDANSETRON HCL 4 MG/2ML IJ SOLN
INTRAMUSCULAR | Status: DC | PRN
  Administered 2023-08-07: 4 mg via INTRAVENOUS

## 2023-08-07 MED ORDER — PROPOFOL 10 MG/ML IV BOLUS
INTRAVENOUS | Status: DC | PRN
  Administered 2023-08-07: 200 mg via INTRAVENOUS

## 2023-08-07 MED ORDER — LACTATED RINGERS IV SOLN: INTRAVENOUS | Status: DC

## 2023-08-07 MED ORDER — SODIUM CHLORIDE 0.9% FLUSH: 3.0000 mL | INTRAVENOUS | Status: DC | PRN

## 2023-08-07 MED ORDER — DULAGLUTIDE 0.75 MG/0.5ML ~~LOC~~ SOAJ: 0.7500 mg | SUBCUTANEOUS | Status: DC

## 2023-08-07 MED ORDER — HYDROCODONE-ACETAMINOPHEN 5-325 MG PO TABS
1.0000 | ORAL_TABLET | ORAL | Status: DC | PRN
  Filled 2023-08-07: qty 1

## 2023-08-07 MED ORDER — ROCURONIUM BROMIDE 10 MG/ML (PF) SYRINGE
PREFILLED_SYRINGE | INTRAVENOUS | Status: DC | PRN
  Administered 2023-08-07: 70 mg via INTRAVENOUS

## 2023-08-07 MED ORDER — FENTANYL CITRATE (PF) 250 MCG/5ML IJ SOLN
INTRAMUSCULAR | Status: AC
  Filled 2023-08-07: qty 5

## 2023-08-07 MED ORDER — MIDAZOLAM HCL 2 MG/2ML IJ SOLN
INTRAMUSCULAR | Status: DC | PRN
  Administered 2023-08-07: 2 mg via INTRAVENOUS

## 2023-08-07 MED ORDER — LEVOTHYROXINE SODIUM 75 MCG PO TABS
75.0000 ug | ORAL_TABLET | Freq: Every day | ORAL | Status: DC
  Administered 2023-08-08: 75 ug via ORAL
  Filled 2023-08-07: qty 1

## 2023-08-07 MED ORDER — CEFAZOLIN SODIUM-DEXTROSE 2-4 GM/100ML-% IV SOLN
2.0000 g | INTRAVENOUS | Status: AC
  Administered 2023-08-07: 2 g via INTRAVENOUS

## 2023-08-07 MED ORDER — DOCUSATE SODIUM 100 MG PO CAPS
100.0000 mg | ORAL_CAPSULE | Freq: Two times a day (BID) | ORAL | Status: DC
Start: 2023-08-07 — End: 2023-08-08
  Administered 2023-08-07: 100 mg via ORAL
  Filled 2023-08-07 (×2): qty 1

## 2023-08-07 MED ORDER — HYDROMORPHONE HCL 1 MG/ML IJ SOLN
0.5000 mg | INTRAMUSCULAR | Status: DC | PRN
Start: 2023-08-07 — End: 2023-08-08

## 2023-08-07 MED ORDER — FENTANYL CITRATE (PF) 100 MCG/2ML IJ SOLN: 25.0000 ug | INTRAMUSCULAR | Status: DC | PRN

## 2023-08-07 MED ORDER — HEMOSTATIC AGENTS (NO CHARGE) OPTIME
TOPICAL | Status: DC | PRN
  Administered 2023-08-07: 1 via TOPICAL

## 2023-08-07 MED ORDER — CHLORHEXIDINE GLUCONATE 0.12 % MT SOLN
OROMUCOSAL | Status: AC
  Administered 2023-08-07: 15 mL via OROMUCOSAL
  Filled 2023-08-07: qty 15

## 2023-08-07 MED ORDER — THROMBIN 5000 UNITS EX SOLR
CUTANEOUS | Status: AC
  Filled 2023-08-07: qty 5000

## 2023-08-07 MED ORDER — BUPIVACAINE LIPOSOME 1.3 % IJ SUSP
INTRAMUSCULAR | Status: AC
  Filled 2023-08-07: qty 20

## 2023-08-07 MED ORDER — METHOCARBAMOL 500 MG PO TABS
500.0000 mg | ORAL_TABLET | Freq: Four times a day (QID) | ORAL | Status: DC | PRN
Start: 2023-08-07 — End: 2023-08-08
  Administered 2023-08-08: 500 mg via ORAL
  Filled 2023-08-07: qty 1

## 2023-08-07 MED ORDER — ONDANSETRON HCL 4 MG PO TABS: 4.0000 mg | ORAL_TABLET | Freq: Four times a day (QID) | ORAL | Status: DC | PRN

## 2023-08-07 MED ORDER — ONDANSETRON HCL 4 MG/2ML IJ SOLN: 4.0000 mg | Freq: Four times a day (QID) | INTRAMUSCULAR | Status: DC | PRN

## 2023-08-07 MED ORDER — CEFAZOLIN SODIUM-DEXTROSE 2-4 GM/100ML-% IV SOLN
INTRAVENOUS | Status: AC
  Filled 2023-08-07: qty 100

## 2023-08-07 MED ORDER — BUPIVACAINE-EPINEPHRINE (PF) 0.5% -1:200000 IJ SOLN
INTRAMUSCULAR | Status: AC
  Filled 2023-08-07: qty 30

## 2023-08-07 MED ORDER — GLIMEPIRIDE 2 MG PO TABS
2.0000 mg | ORAL_TABLET | Freq: Every day | ORAL | Status: DC
  Administered 2023-08-07: 2 mg via ORAL
  Filled 2023-08-07 (×2): qty 1

## 2023-08-07 MED ORDER — METHOCARBAMOL 1000 MG/10ML IJ SOLN
500.0000 mg | Freq: Four times a day (QID) | INTRAMUSCULAR | Status: DC | PRN
Start: 2023-08-07 — End: 2023-08-08

## 2023-08-07 MED ORDER — MENTHOL 3 MG MT LOZG
1.0000 | LOZENGE | OROMUCOSAL | Status: DC | PRN
Start: 2023-08-07 — End: 2023-08-08

## 2023-08-07 MED ORDER — HEPARIN SODIUM (PORCINE) 5000 UNIT/ML IJ SOLN
5000.0000 [IU] | Freq: Three times a day (TID) | INTRAMUSCULAR | Status: AC
  Administered 2023-08-08: 5000 [IU] via SUBCUTANEOUS
  Filled 2023-08-07: qty 1

## 2023-08-07 MED ORDER — LIDOCAINE-EPINEPHRINE 1 %-1:100000 IJ SOLN
INTRAMUSCULAR | Status: AC
  Filled 2023-08-07: qty 1

## 2023-08-07 MED ORDER — ACETAMINOPHEN 650 MG RE SUPP: 650.0000 mg | RECTAL | Status: DC | PRN

## 2023-08-07 MED ORDER — DAPAGLIFLOZIN PROPANEDIOL 10 MG PO TABS
10.0000 mg | ORAL_TABLET | Freq: Every day | ORAL | Status: DC
Start: 2023-08-08 — End: 2023-08-08
  Filled 2023-08-07: qty 1

## 2023-08-07 SURGICAL SUPPLY — 50 items
BAG COUNTER SPONGE SURGICOUNT (BAG) ×1 IMPLANT
BUR CARBIDE MATCH 3.0 (BURR) ×1 IMPLANT
CNTNR URN SCR LID CUP LEK RST (MISCELLANEOUS) ×1 IMPLANT
COVER MAYO STAND STRL (DRAPES) ×1 IMPLANT
DERMABOND ADVANCED .7 DNX12 (GAUZE/BANDAGES/DRESSINGS) IMPLANT
DRAIN JACKSON RD 7FR 3/32 (WOUND CARE) IMPLANT
DRAPE C-ARM 42X72 X-RAY (DRAPES) ×1 IMPLANT
DRAPE LAPAROTOMY 100X72X124 (DRAPES) ×1 IMPLANT
DRAPE MICROSCOPE SLANT 54X150 (MISCELLANEOUS) ×1 IMPLANT
DRAPE SURG 17X23 STRL (DRAPES) ×1 IMPLANT
DRSG OPSITE POSTOP 4X6 (GAUZE/BANDAGES/DRESSINGS) IMPLANT
DURAPREP 26ML APPLICATOR (WOUND CARE) ×1 IMPLANT
ELECT BLADE INSULATED 4IN (ELECTROSURGICAL) ×1
ELECT COATED BLADE 2.86 ST (ELECTRODE) ×1 IMPLANT
ELECT REM PT RETURN 9FT ADLT (ELECTROSURGICAL) ×1
ELECTRODE BLADE INSULATED 4IN (ELECTROSURGICAL) ×1 IMPLANT
ELECTRODE REM PT RTRN 9FT ADLT (ELECTROSURGICAL) ×1 IMPLANT
EVACUATOR 1/8 PVC DRAIN (DRAIN) IMPLANT
GAUZE 4X4 16PLY ~~LOC~~+RFID DBL (SPONGE) IMPLANT
GAUZE SPONGE 4X4 12PLY STRL (GAUZE/BANDAGES/DRESSINGS) ×1 IMPLANT
GLOVE BIO SURGEON STRL SZ7 (GLOVE) ×1 IMPLANT
GLOVE BIOGEL PI IND STRL 7.5 (GLOVE) ×1 IMPLANT
GLOVE BIOGEL PI IND STRL 8 (GLOVE) ×1 IMPLANT
GLOVE ECLIPSE 8.0 STRL XLNG CF (GLOVE) ×2 IMPLANT
GOWN STRL REUS W/ TWL LRG LVL3 (GOWN DISPOSABLE) IMPLANT
GOWN STRL REUS W/ TWL XL LVL3 (GOWN DISPOSABLE) ×2 IMPLANT
GOWN STRL REUS W/TWL 2XL LVL3 (GOWN DISPOSABLE) IMPLANT
HEMOSTAT POWDER KIT SURGIFOAM (HEMOSTASIS) ×1 IMPLANT
KIT BASIN OR (CUSTOM PROCEDURE TRAY) ×1 IMPLANT
KIT POSITION SURG JACKSON T1 (MISCELLANEOUS) ×1 IMPLANT
KIT TURNOVER KIT B (KITS) ×1 IMPLANT
MARKER SKIN DUAL TIP RULER LAB (MISCELLANEOUS) ×1 IMPLANT
NDL HYPO 25X1 1.5 SAFETY (NEEDLE) ×1 IMPLANT
NEEDLE HYPO 25X1 1.5 SAFETY (NEEDLE) ×1 IMPLANT
NS IRRIG 1000ML POUR BTL (IV SOLUTION) ×1 IMPLANT
PACK LAMINECTOMY NEURO (CUSTOM PROCEDURE TRAY) ×1 IMPLANT
PAD ARMBOARD 7.5X6 YLW CONV (MISCELLANEOUS) ×3 IMPLANT
PATTIES SURGICAL .5 X.5 (GAUZE/BANDAGES/DRESSINGS) IMPLANT
PATTIES SURGICAL .5 X1 (DISPOSABLE) IMPLANT
PATTIES SURGICAL 1X1 (DISPOSABLE) IMPLANT
SPIKE FLUID TRANSFER (MISCELLANEOUS) ×1 IMPLANT
SPONGE SURGIFOAM ABS GEL SZ50 (HEMOSTASIS) ×1 IMPLANT
SPONGE T-LAP 4X18 ~~LOC~~+RFID (SPONGE) IMPLANT
STAPLER VISISTAT 35W (STAPLE) IMPLANT
SUT VIC AB 0 CT1 18XCR BRD8 (SUTURE) ×1 IMPLANT
SUT VIC AB 2-0 CP2 18 (SUTURE) ×1 IMPLANT
SUT VIC AB 3-0 SH 8-18 (SUTURE) ×1 IMPLANT
TOWEL GREEN STERILE (TOWEL DISPOSABLE) ×1 IMPLANT
TOWEL GREEN STERILE FF (TOWEL DISPOSABLE) ×1 IMPLANT
WATER STERILE IRR 1000ML POUR (IV SOLUTION) ×1 IMPLANT

## 2023-08-07 NOTE — Op Note (Signed)
  Providing Compassionate, Quality Care - Together   Date of service: 08/07/2023   PREOP DIAGNOSIS:  L2-3 lumbar stenosis with neurogenic claudication   POSTOP DIAGNOSIS: Same   PROCEDURE: Open bilateral L2, L3 laminectomy, medial facetectomies for decompression neural elements Intraoperative use of microscope for microdissection Intraoperative use of fluoroscopy   SURGEON: Dr. Kendell Bane C. Roma Bondar, DO   ASSISTANT: Dr. Lisbeth Renshaw, MD; Patrici Ranks, PA   ANESTHESIA: General Endotracheal   EBL: 15 cc   SPECIMENS: None   DRAINS: none   COMPLICATIONS: none   CONDITION: Hemodynamically stable   HISTORY: Brandon Norman is a 68 y.o. male with complaints of BLE neurogenic claudication. His complaints were resistant to conservative therapies. MRI revealed multifactorial stenosis severe at L2-3 with compression. I offered a open laminectomy and medial facetectomies for decompression, informed consent obtained. All risks benefits and outcomes discussed and agreed upon.    PROCEDURE IN DETAIL: The patient was brought to the operating room. After induction of general anesthesia, the patient was positioned on the operative table in the prone position. All pressure points were meticulously padded. Skin incision was then marked out and prepped and draped in the usual sterile fashion.   Using a 10 blade, midline incision was created over the L2-3 spinous processes.  Using Bovie electrocautery soft tissue dissection was performed down to the lumbodorsal fascia.  Subperiosteal dissection was performed bilaterally with Bovie electrocautery exposing the L2, L3 lamina bilaterally.  Deep retractors placed in the wound.  Lateral fluoroscopy confirmed the appropriate level.  The microscope was sterilely draped and brought into the field.  Using Leksell rongeur, the spinous process of L2, L3 were removed down to the lamina.  Using a high-speed drill, the L2 lamina was removed bilaterally to the  lateral recess down to the ligamentum flavum and superiorly up to the ligamentous attachment bilaterally.  Using high-speed drill, a partial bilateral facetectomy was performed down to the ligamentum flavum.  The superior portion of the L3 lamina was then removed to the epidural space with the high speed drill. The ligamentum flavum was then gently dissected from the epidural space and resected with a series of Kerrison rongeurs to the lateral recess bilaterally.  Using a ball-tipped probe, bilateral lateral recesses appeared decompressed.  The bilateral lateral recess were explored again with a ball-tipped probe and noted to be appropriately decompressed.  The thecal sac was pulsatile.  Bilateral foramen were also palpated with a ball-tipped probe and noted to be adequately decompressed.   Epidural hemostasis achieved with surgifoam.  A mixture of Depo-Medrol and fentanyl was placed in the epidural space with Avitene. Deep retractor was taken out of the wound.  Hemostasis was achieved with bipolar cautery and the soft tissues.  The wound was closed in layers with 0 Vicryl sutures for muscle and fascia.  Dermis was closed with 2-0 and 3-0 Vicryl sutures.  Skin was closed with skin glue.  Sterile dressing was applied.   At the end of the case all sponge, needle, and instrument counts were correct. The patient was then transferred to the stretcher, extubated, and taken to the post-anesthesia care unit in stable hemodynamic condition.

## 2023-08-07 NOTE — Anesthesia Procedure Notes (Signed)
Procedure Name: Intubation Date/Time: 08/07/2023 1:04 PM  Performed by: Sandie Ano, CRNAPre-anesthesia Checklist: Patient identified, Emergency Drugs available, Suction available and Patient being monitored Patient Re-evaluated:Patient Re-evaluated prior to induction Oxygen Delivery Method: Circle System Utilized Preoxygenation: Pre-oxygenation with 100% oxygen Induction Type: IV induction Ventilation: Mask ventilation without difficulty Laryngoscope Size: Mac and 3 Grade View: Grade I Tube type: Oral Tube size: 7.5 mm Number of attempts: 1 Airway Equipment and Method: Stylet and Oral airway Placement Confirmation: ETT inserted through vocal cords under direct vision, positive ETCO2 and breath sounds checked- equal and bilateral Secured at: 22 cm Tube secured with: Tape Dental Injury: Teeth and Oropharynx as per pre-operative assessment

## 2023-08-07 NOTE — Transfer of Care (Signed)
Immediate Anesthesia Transfer of Care Note  Patient: Brandon Norman  Procedure(s) Performed: OPEN LUMBAR LAMINECTOMY, MEDIAL FACETECTOMIES LUMBAR TWO-THREE (Spine Lumbar)  Patient Location: PACU  Anesthesia Type:General  Level of Consciousness: awake, alert , and oriented  Airway & Oxygen Therapy: Patient Spontanous Breathing  Post-op Assessment: Report given to RN and Post -op Vital signs reviewed and stable  Post vital signs: Reviewed and stable  Last Vitals:  Vitals Value Taken Time  BP 135/82 08/07/23 1420  Temp    Pulse 76 08/07/23 1423  Resp 12 08/07/23 1423  SpO2 88 % 08/07/23 1423  Vitals shown include unfiled device data.  Last Pain:  Vitals:   08/07/23 1044  TempSrc:   PainSc: 0-No pain         Complications: No notable events documented.

## 2023-08-07 NOTE — H&P (Signed)
Providing Compassionate, Quality Care - Together  NEUROSURGERY HISTORY & PHYSICAL   Brandon Norman is an 68 y.o. male.   Chief Complaint: Neurogenic claudication HPI: This is a 68 year old male with a history of DVT, chronic kidney disease, on Xarelto with continued complaints of neurogenic claudication in his bilateral lower extremities.  He has had progressively worsening symptomatology it is altering his daily lifestyle.  MRI revealed multifactorial lumbar stenosis at L2-3, he presents today for surgical intervention.  Past Medical History:  Diagnosis Date   Acute DVT (deep venous thrombosis) (HCC) 04/03/2020   Arthritis    Basal cell carcinoma    Cancer (HCC)    Chronic kidney disease    CKD3   Diabetes mellitus due to underlying condition with unspecified complications (HCC) 07/30/2015   Dyspnea    History of colon polyps 10/01/2011   tubular adenomas   History of kidney stones    History of pulmonary embolism 02/03/2019   Hyperlipidemia 04/07/2017   Hypertension    Hypothyroidism    Peripheral vascular disease (HCC)    PE/DVT on Xarelto   Pulmonary embolism (HCC) 2015   Thyroid disease     Past Surgical History:  Procedure Laterality Date   COLONOSCOPY  11/28/2015   Colonic polyps status post polypectomy. Mild colonic diverticulosis.    COLONOSCOPY  12/26/2020   WISDOM TOOTH EXTRACTION  1992    Family History  Problem Relation Age of Onset   Heart attack Mother    Heart disease Mother    Thyroid disease Sister    Thyroid disease Sister    Colon cancer Neg Hx    Esophageal cancer Neg Hx    Rectal cancer Neg Hx    Stomach cancer Neg Hx    Social History:  reports that he has never smoked. He has been exposed to tobacco smoke. He has never used smokeless tobacco. He reports that he does not currently use alcohol. He reports that he does not use drugs.  Allergies: No Known Allergies  Medications Prior to Admission  Medication Sig Dispense Refill    Cholecalciferol (VITAMIN D3) 50 MCG CAPS Take 2,000 Units by mouth daily.     enoxaparin (LOVENOX) 80 MG/0.8ML injection INJECT 0.8MLS INTO THE SKIN EVERY 12 HOURS 0.8 mL 0   ezetimibe (ZETIA) 10 MG tablet Take 10 mg by mouth daily.     FARXIGA 10 MG TABS tablet Take 10 mg by mouth daily.     fenofibrate 160 MG tablet Take 160 mg by mouth every evening.     glimepiride (AMARYL) 2 MG tablet Take 2 mg by mouth daily.     levothyroxine (SYNTHROID) 75 MCG tablet Take 75 mcg by mouth daily before breakfast.     Omega-3 Fatty Acids (FISH OIL) 1000 MG CAPS Take 2 capsules (2,000 mg total) by mouth 2 (two) times daily. 180 capsule 12   rivaroxaban (XARELTO) 20 MG TABS tablet Take 20 mg by mouth daily.     traMADol (ULTRAM) 50 MG tablet Take 50 mg by mouth at bedtime as needed for moderate pain (pain score 4-6).     TRULICITY 0.75 MG/0.5ML SOPN Inject 0.75 mg into the skin once a week.     acetaminophen (TYLENOL) 325 MG tablet Take 650 mg by mouth every 6 (six) hours as needed for moderate pain (pain score 4-6).      Results for orders placed or performed during the hospital encounter of 08/07/23 (from the past 48 hours)  Glucose, capillary  Status: Abnormal   Collection Time: 08/07/23 10:31 AM  Result Value Ref Range   Glucose-Capillary 134 (H) 70 - 99 mg/dL    Comment: Glucose reference range applies only to samples taken after fasting for at least 8 hours.   Comment 1 Notify RN   Glucose, capillary     Status: Abnormal   Collection Time: 08/07/23 12:39 PM  Result Value Ref Range   Glucose-Capillary 127 (H) 70 - 99 mg/dL    Comment: Glucose reference range applies only to samples taken after fasting for at least 8 hours.   No results found.  ROS All pertinent positives and negatives are listed in HPI above  Blood pressure (!) 141/90, pulse 90, temperature 97.7 F (36.5 C), temperature source Oral, resp. rate 18, height 5\' 8"  (1.727 m), weight 81.2 kg, SpO2 96%. Physical Exam  Awake  alert oriented x 3, no acute distress PERRLA Cranial nerves II through XII intact Bilateral upper and lower extremities full-strength throughout Speech fluent and appropriate Nonlabored breathing   Assessment/Plan 68 year old male with  L2-3 multifactorial stenosis with neurogenic claudication  -OR today for L2-3 open laminectomy, medial facetectomies.  We discussed all risks, benefits and expected outcomes as well as alternatives to treatment.  Clearance was obtained, he has been off his Xarelto, was given a Lovenox bridge.  We will plan for postoperative subcu heparin to begin tomorrow during his hospitalization, plans for discharge tomorrow morning  Thank you for allowing me to participate in this patient's care.  Please do not hesitate to call with questions or concerns.   Monia Pouch, DO Neurosurgeon [facility] [phone]

## 2023-08-08 ENCOUNTER — Encounter (HOSPITAL_COMMUNITY): Payer: Self-pay | Admitting: Neurological Surgery

## 2023-08-08 DIAGNOSIS — Z85828 Personal history of other malignant neoplasm of skin: Secondary | ICD-10-CM | POA: Diagnosis not present

## 2023-08-08 DIAGNOSIS — E1122 Type 2 diabetes mellitus with diabetic chronic kidney disease: Secondary | ICD-10-CM | POA: Diagnosis not present

## 2023-08-08 DIAGNOSIS — Z86718 Personal history of other venous thrombosis and embolism: Secondary | ICD-10-CM | POA: Diagnosis not present

## 2023-08-08 DIAGNOSIS — M48062 Spinal stenosis, lumbar region with neurogenic claudication: Secondary | ICD-10-CM | POA: Diagnosis not present

## 2023-08-08 DIAGNOSIS — E039 Hypothyroidism, unspecified: Secondary | ICD-10-CM | POA: Diagnosis not present

## 2023-08-08 DIAGNOSIS — N183 Chronic kidney disease, stage 3 unspecified: Secondary | ICD-10-CM | POA: Diagnosis not present

## 2023-08-08 LAB — GLUCOSE, CAPILLARY: Glucose-Capillary: 160 mg/dL — ABNORMAL HIGH (ref 70–99)

## 2023-08-08 MED ORDER — HYDROCODONE-ACETAMINOPHEN 5-325 MG PO TABS: 1.0000 | ORAL_TABLET | Freq: Four times a day (QID) | ORAL | 0 refills | Status: AC | PRN

## 2023-08-08 MED ORDER — METHOCARBAMOL 500 MG PO TABS: 500.0000 mg | ORAL_TABLET | Freq: Four times a day (QID) | ORAL | 0 refills | Status: DC | PRN

## 2023-08-08 NOTE — Progress Notes (Signed)
PT Cancellation Note and Discharge  Patient Details Name: Brandon Norman MRN: [id number] DOB: 01-Oct-1955   Cancelled Treatment:    Reason Eval/Treat Not Completed: PT screened, no needs identified, will sign off. Discussed pt case with OT who reports pt is currently mobilizing at a modified independent level and does not require a formal PT evaluation at this time. PT signing off. If needs change, please reconsult.     Marylynn Pearson 08/08/2023, 8:46 AM  Conni Slipper, PT, DPT Acute Rehabilitation Services Secure Chat Preferred Office: [phone]

## 2023-08-08 NOTE — Progress Notes (Signed)
Patient alert and oriented, void, ambulate. Surgical site clean and dry no sign of infection. D/ c instructions explain and given to the patient all questions answered.

## 2023-08-08 NOTE — Care Management (Signed)
Patient with order to DC to home today. Unit staff to provide DME needed for home.   No HH needs identified Patient will have family/ friends provide transportation home. No other TOC needs identified for DC

## 2023-08-08 NOTE — Evaluation (Signed)
Occupational Therapy Evaluation Patient Details Name: Brandon Norman MRN: [id number] DOB: 08/28/1955 Today's Date: 08/08/2023   History of Present Illness Pt is a 68 yr old male who presented 08/07/23 due to neurogenic claudication of BLE. Pt s/p L2/3 laminectomy. PMH: CKD, DVT, DM, PE, HTN   Clinical Impression   Pt reported at PLOF they required no DME/AE but noted to just have a decline in ambulation to pain. However, today pt reported just pain at 1/10 and required no assist for ambulation. Pt was able to report all precautions at this time and completed simulated dressing and bed mobility with modified independence. He did not have any further concerns. Occupational Therapy signing off. Thank you.       If plan is discharge home, recommend the following: Assist for transportation    Functional Status Assessment  Patient has had a recent decline in their functional status and demonstrates the ability to make significant improvements in function in a reasonable and predictable amount of time.  Equipment Recommendations  None recommended by OT    Recommendations for Other Services       Precautions / Restrictions Precautions Precautions: Back Precaution Booklet Issued: Yes (comment) Precaution Comments: Pt reported an understanding Restrictions Weight Bearing Restrictions Per Provider Order: No      Mobility Bed Mobility Overal bed mobility: Modified Independent             General bed mobility comments: increase in timebut was able to demonstrate log roll back to therapist following reviewing    Transfers Overall transfer level: Modified independent Equipment used: None               General transfer comment: just cues to pace on first attempt      Balance Overall balance assessment: Mild deficits observed, not formally tested                                         ADL either performed or assessed with clinical judgement   ADL  Overall ADL's : Modified independent                                       General ADL Comments: increase in time and modifications as needed     Vision Baseline Vision/History: 1 Wears glasses Ability to See in Adequate Light: 0 Adequate Patient Visual Report: No change from baseline Vision Assessment?: No apparent visual deficits     Perception Perception: Not tested       Praxis Praxis: Not tested       Pertinent Vitals/Pain Pain Assessment Pain Assessment: 0-10 Pain Score: 1  Pain Descriptors / Indicators: Aching, Discomfort Pain Intervention(s): Limited activity within patient's tolerance, Monitored during session, Repositioned     Extremity/Trunk Assessment Upper Extremity Assessment Upper Extremity Assessment: LUE deficits/detail LUE Deficits / Details: Pt reported sometimes limitations  but this is not new. LUE Sensation: WNL LUE Coordination: decreased gross motor   Lower Extremity Assessment Lower Extremity Assessment: Defer to PT evaluation   Cervical / Trunk Assessment Cervical / Trunk Assessment: Back Surgery   Communication Communication Communication: No apparent difficulties   Cognition Arousal: Alert Behavior During Therapy: WFL for tasks assessed/performed Overall Cognitive Status: Within Functional Limits for tasks assessed  General Comments       Exercises     Shoulder Instructions      Home Living Family/patient expects to be discharged to:: Private residence Living Arrangements: Spouse/significant other Available Help at Discharge: Family Type of Home: House Home Access: Other (comment) (one step entrance)           Bathroom Shower/Tub: Producer, television/film/video: Standard Bathroom Accessibility: Yes   Home Equipment: Grab bars - toilet;Grab bars - tub/shower;Hand held shower head;Shower seat          Prior Functioning/Environment Prior Level  of Function : Independent/Modified Independent             Mobility Comments: Pt reported they had decline in mobility as they had increase in pain.          OT Problem List: Decreased knowledge of use of DME or AE;Decreased knowledge of precautions;Decreased activity tolerance;Impaired balance (sitting and/or standing);Pain      OT Treatment/Interventions:      OT Goals(Current goals can be found in the care plan section) Acute Rehab OT Goals Patient Stated Goal: to go back home OT Goal Formulation: With patient Time For Goal Achievement: 08/22/23 Potential to Achieve Goals: Good  OT Frequency:      Co-evaluation              AM-PAC OT "6 Clicks" Daily Activity     Outcome Measure Help from another person eating meals?: None Help from another person taking care of personal grooming?: None Help from another person toileting, which includes using toliet, bedpan, or urinal?: None Help from another person bathing (including washing, rinsing, drying)?: None Help from another person to put on and taking off regular upper body clothing?: None Help from another person to put on and taking off regular lower body clothing?: None 6 Click Score: 24   End of Session Equipment Utilized During Treatment: Gait belt Nurse Communication: Mobility status  Activity Tolerance: Patient tolerated treatment well Patient left: in chair;with call bell/phone within reach  OT Visit Diagnosis: Pain Pain - Right/Left:  (back)                Time: 1610-9604 OT Time Calculation (min): 30 min Charges:  OT General Charges $OT Visit: 1 Visit OT Evaluation $OT Eval Low Complexity: 1 Low OT Treatments $Self Care/Home Management : 8-22 mins  Presley Raddle OTR/L  Acute Rehab Services  [phone] office number   Alphia Moh 08/08/2023, 8:44 AM

## 2023-08-08 NOTE — Discharge Summary (Signed)
Physician Discharge Summary  Patient ID: Brandon Norman MRN: [id number] DOB/AGE: 1956/04/05 68 y.o.  Admit date: 08/07/2023 Discharge date: 08/08/2023  Admission Diagnoses:  Lumbar spinal stenosis  Discharge Diagnoses:  Same Principal Problem:   Lumbar spinal stenosis   Discharged Condition: Stable  Hospital Course:  Brandon Norman is a 68 y.o. male who underwent uncomplicated lumbar decompression. He was at baseline postop, ambulating well, tolerating diet and requested d/c home.  Treatments: Surgery - Lumbar laminectomy, L2-3  Discharge Exam: Blood pressure 137/78, pulse 88, temperature 98.2 F (36.8 C), temperature source Oral, resp. rate 20, height 5\' 8"  (1.727 m), weight 81.2 kg, SpO2 97%. Awake, alert, oriented Speech fluent, appropriate CN grossly intact 5/5 BUE/BLE Wound c/d/i  Disposition: Discharge disposition: 01-Home or Self Care       Discharge Instructions     Call MD for:  redness, tenderness, or signs of infection (pain, swelling, redness, odor or green/yellow discharge around incision site)   Complete by: As directed    Call MD for:  temperature >100.4   Complete by: As directed    Diet - low sodium heart healthy   Complete by: As directed    Discharge instructions   Complete by: As directed    Walk at home as much as possible, at least 4 times / day   Incentive spirometry RT   Complete by: As directed    Increase activity slowly   Complete by: As directed    Lifting restrictions   Complete by: As directed    No lifting > 10 lbs   May shower / Bathe   Complete by: As directed    48 hours after surgery   May walk up steps   Complete by: As directed    Other Restrictions   Complete by: As directed    No bending/twisting at waist   Remove dressing in 24 hours   Complete by: As directed       Allergies as of 08/08/2023   No Known Allergies      Medication List     PAUSE taking these medications    rivaroxaban 20 MG Tabs  tablet Wait to take this until: August 14, 2023 Commonly known as: XARELTO Take 20 mg by mouth daily.       STOP taking these medications    enoxaparin 80 MG/0.8ML injection Commonly known as: LOVENOX   traMADol 50 MG tablet Commonly known as: ULTRAM       TAKE these medications    acetaminophen 325 MG tablet Commonly known as: TYLENOL Take 650 mg by mouth every 6 (six) hours as needed for moderate pain (pain score 4-6).   ezetimibe 10 MG tablet Commonly known as: ZETIA Take 10 mg by mouth daily.   Farxiga 10 MG Tabs tablet Generic drug: dapagliflozin propanediol Take 10 mg by mouth daily.   fenofibrate 160 MG tablet Take 160 mg by mouth every evening.   Fish Oil 1000 MG Caps Take 2 capsules (2,000 mg total) by mouth 2 (two) times daily.   glimepiride 2 MG tablet Commonly known as: AMARYL Take 2 mg by mouth daily.   HYDROcodone-acetaminophen 5-325 MG tablet Commonly known as: NORCO/VICODIN Take 1 tablet by mouth every 6 (six) hours as needed for up to 7 days for moderate pain (pain score 4-6).   levothyroxine 75 MCG tablet Commonly known as: SYNTHROID Take 75 mcg by mouth daily before breakfast.   methocarbamol 500 MG tablet Commonly known as: ROBAXIN Take 1  tablet (500 mg total) by mouth every 6 (six) hours as needed for muscle spasms.   Trulicity 0.75 MG/0.5ML Soaj Generic drug: Dulaglutide Inject 0.75 mg into the skin once a week.   Vitamin D3 50 MCG Caps Take 2,000 Units by mouth daily.        Follow-up Information     Dawley, Troy C, DO Follow up in 3 week(s).   Contact information: [address] [place] [address] [phone]                 Signed: Jackelyn Hoehn 08/08/2023, 8:21 AM

## 2023-08-10 ENCOUNTER — Encounter (HOSPITAL_COMMUNITY): Payer: Self-pay | Admitting: Neurological Surgery

## 2023-08-10 MED ORDER — METHYLPREDNISOLONE ACETATE 80 MG/ML IJ SUSP
INTRAMUSCULAR | Status: DC | PRN
  Administered 2023-08-07: 40 mg

## 2023-08-10 NOTE — Anesthesia Postprocedure Evaluation (Signed)
Anesthesia Post Note  Patient: Brandon Norman  Procedure(s) Performed: OPEN LUMBAR LAMINECTOMY, MEDIAL FACETECTOMIES LUMBAR TWO-THREE (Spine Lumbar)     Patient location during evaluation: PACU Anesthesia Type: General Level of consciousness: awake and alert Pain management: pain level controlled Vital Signs Assessment: post-procedure vital signs reviewed and stable Respiratory status: spontaneous breathing, nonlabored ventilation and respiratory function stable Cardiovascular status: blood pressure returned to baseline and stable Postop Assessment: no apparent nausea or vomiting Anesthetic complications: no   No notable events documented.               Halee Glynn

## 2023-08-26 DIAGNOSIS — M19012 Primary osteoarthritis, left shoulder: Secondary | ICD-10-CM | POA: Diagnosis not present

## 2023-08-31 DIAGNOSIS — M25512 Pain in left shoulder: Secondary | ICD-10-CM | POA: Diagnosis not present

## 2023-09-18 DIAGNOSIS — M19012 Primary osteoarthritis, left shoulder: Secondary | ICD-10-CM | POA: Diagnosis not present

## 2023-09-22 DIAGNOSIS — Z6828 Body mass index (BMI) 28.0-28.9, adult: Secondary | ICD-10-CM | POA: Diagnosis not present

## 2023-09-22 DIAGNOSIS — E559 Vitamin D deficiency, unspecified: Secondary | ICD-10-CM | POA: Diagnosis not present

## 2023-09-22 DIAGNOSIS — E785 Hyperlipidemia, unspecified: Secondary | ICD-10-CM | POA: Diagnosis not present

## 2023-09-22 DIAGNOSIS — I825Z1 Chronic embolism and thrombosis of unspecified deep veins of right distal lower extremity: Secondary | ICD-10-CM | POA: Diagnosis not present

## 2023-09-22 DIAGNOSIS — E1169 Type 2 diabetes mellitus with other specified complication: Secondary | ICD-10-CM | POA: Diagnosis not present

## 2023-09-22 DIAGNOSIS — E039 Hypothyroidism, unspecified: Secondary | ICD-10-CM | POA: Diagnosis not present

## 2023-09-22 DIAGNOSIS — M48062 Spinal stenosis, lumbar region with neurogenic claudication: Secondary | ICD-10-CM | POA: Diagnosis not present

## 2023-09-22 DIAGNOSIS — K219 Gastro-esophageal reflux disease without esophagitis: Secondary | ICD-10-CM | POA: Diagnosis not present

## 2023-09-22 DIAGNOSIS — M7918 Myalgia, other site: Secondary | ICD-10-CM | POA: Diagnosis not present

## 2023-09-22 DIAGNOSIS — N183 Chronic kidney disease, stage 3 unspecified: Secondary | ICD-10-CM | POA: Diagnosis not present

## 2023-09-23 ENCOUNTER — Telehealth: Payer: Self-pay

## 2023-09-23 NOTE — Telephone Encounter (Signed)
   Pre-operative Risk Assessment    Patient Name: Brandon Norman  DOB: 17-Apr-1956 MRN: [id number]   Date of last office visit: 06/19/23 Date of next office visit: N/A   Request for Surgical Clearance    Procedure:   left total shoulder replacement  Date of Surgery:  Clearance TBD                                Surgeon:  Dr. Jones Broom Surgeon's Group or Practice Name:  [facility] Phone number:  [phone] Fax number:  [phone]   Type of Clearance Requested:   - Medical    Type of Anesthesia:   choice   Additional requests/questions:    SignedDione Housekeeper   09/23/2023, 3:10 PM

## 2023-09-25 ENCOUNTER — Telehealth: Payer: Self-pay | Admitting: *Deleted

## 2023-09-25 NOTE — Telephone Encounter (Signed)
 Patient with diagnosis of prior PE/DVT on Xarelto for anticoagulation.    Procedure:   left total shoulder replacement   Date of Surgery:  Clearance TBD   CrCl 50 Platelet count 21  Per office protocol, patient can hold Xarelto for 3 days prior to procedure.   Patient WILL need bridging with Lovenox (enoxaparin) around procedure.  he previously stopped his DOAC for lithotripsy in the past and developed subsequent thromboembolism. Per recommendations per Dr. Tomie China (2024) plan to bridge with Lovenox.   Patient will need to reach out to our office when date is determined, and we can set up the bridging protocol.   **This guidance is not considered finalized until pre-operative APP has relayed final recommendations.**

## 2023-09-25 NOTE — Telephone Encounter (Signed)
  Patient Consent for Virtual Visit        [name] has provided verbal consent on 09/25/2023 for a virtual visit (video or telephone).   CONSENT FOR VIRTUAL VISIT FOR:  Brandon Norman  By participating in this virtual visit I agree to the following:  I hereby voluntarily request, consent and authorize [facility] and its employed or contracted physicians, physician assistants, nurse practitioners or other licensed health care professionals (the Practitioner), to provide me with telemedicine health care services (the "Services") as deemed necessary by the treating Practitioner. I acknowledge and consent to receive the Services by the Practitioner via telemedicine. I understand that the telemedicine visit will involve communicating with the Practitioner through live audiovisual communication technology and the disclosure of certain medical information by electronic transmission. I acknowledge that I have been given the opportunity to request an in-person assessment or other available alternative prior to the telemedicine visit and am voluntarily participating in the telemedicine visit.  I understand that I have the right to withhold or withdraw my consent to the use of telemedicine in the course of my care at any time, without affecting my right to future care or treatment, and that the Practitioner or I may terminate the telemedicine visit at any time. I understand that I have the right to inspect all information obtained and/or recorded in the course of the telemedicine visit and may receive copies of available information for a reasonable fee.  I understand that some of the potential risks of receiving the Services via telemedicine include:  Delay or interruption in medical evaluation due to technological equipment failure or disruption; Information transmitted may not be sufficient (e.g. poor resolution of images) to allow for appropriate medical decision making by the  Practitioner; and/or  In rare instances, security protocols could fail, causing a breach of personal health information.  Furthermore, I acknowledge that it is my responsibility to provide information about my medical history, conditions and care that is complete and accurate to the best of my ability. I acknowledge that Practitioner's advice, recommendations, and/or decision may be based on factors not within their control, such as incomplete or inaccurate data provided by me or distortions of diagnostic images or specimens that may result from electronic transmissions. I understand that the practice of medicine is not an exact science and that Practitioner makes no warranties or guarantees regarding treatment outcomes. I acknowledge that a copy of this consent can be made available to me via my patient portal [facility] MyChart), or I can request a printed copy by calling the office of [facility].    I understand that my insurance will be billed for this visit.   I have read or had this consent read to me. I understand the contents of this consent, which adequately explains the benefits and risks of the Services being provided via telemedicine.  I have been provided ample opportunity to ask questions regarding this consent and the Services and have had my questions answered to my satisfaction. I give my informed consent for the services to be provided through the use of telemedicine in my medical care

## 2023-09-25 NOTE — Telephone Encounter (Signed)
 Pt scheduled tele preop appt 10/26/23. Med rec and consent are done. Pt states his surgery is planned for sometime early June. Pt aware he will need Lovenox bridging.

## 2023-09-25 NOTE — Telephone Encounter (Signed)
   Name: Brandon Norman  DOB: 1956-03-07  MRN: [id number]  Primary Cardiologist: Garwin Brothers, MD   Preoperative team, please contact this patient and set up a phone call appointment for further preoperative risk assessment. Please obtain consent and complete medication review. Thank you for your help.  I confirm that guidance regarding antiplatelet and oral anticoagulation therapy has been completed and, if necessary, noted below.  Per office protocol, patient can hold Xarelto for 3 days prior to procedure.   Patient WILL need bridging with Lovenox (enoxaparin) around procedure.   he previously stopped his DOAC for lithotripsy in the past and developed subsequent thromboembolism. Per recommendations per Dr. Tomie China (2024) plan to bridge with Lovenox.    Patient will need to reach out to our office when date is determined, and we can set up the bridging protocol.   I also confirmed the patient resides in the state of [place]. As per [facility] Medical Board telemedicine laws, the patient must reside in the state in which the provider is licensed.   Joylene Grapes, NP 09/25/2023, 8:55 AM  [facility]

## 2023-09-30 NOTE — Telephone Encounter (Signed)
 Received fax with updated fax number for clearance to be sent to.  [phone].

## 2023-10-08 DIAGNOSIS — M13812 Other specified arthritis, left shoulder: Secondary | ICD-10-CM | POA: Diagnosis not present

## 2023-10-08 DIAGNOSIS — M25612 Stiffness of left shoulder, not elsewhere classified: Secondary | ICD-10-CM | POA: Diagnosis not present

## 2023-10-08 DIAGNOSIS — M25512 Pain in left shoulder: Secondary | ICD-10-CM | POA: Diagnosis not present

## 2023-10-21 DIAGNOSIS — M25512 Pain in left shoulder: Secondary | ICD-10-CM | POA: Diagnosis not present

## 2023-10-21 DIAGNOSIS — M13812 Other specified arthritis, left shoulder: Secondary | ICD-10-CM | POA: Diagnosis not present

## 2023-10-21 DIAGNOSIS — M25612 Stiffness of left shoulder, not elsewhere classified: Secondary | ICD-10-CM | POA: Diagnosis not present

## 2023-10-26 ENCOUNTER — Ambulatory Visit: Attending: Internal Medicine | Admitting: Emergency Medicine

## 2023-10-26 ENCOUNTER — Encounter: Payer: Self-pay | Admitting: Emergency Medicine

## 2023-10-26 DIAGNOSIS — Z01810 Encounter for preprocedural cardiovascular examination: Secondary | ICD-10-CM | POA: Diagnosis not present

## 2023-10-26 NOTE — Progress Notes (Addendum)
 Virtual Visit via Telephone Note   Because of Brandon Norman co-morbid illnesses, he is at least at moderate risk for complications without adequate follow up.  This format is felt to be most appropriate for this patient at this time.  Due to technical limitations with video connection Web designer), today's appointment will be conducted as an audio only telehealth visit, and [name] verbally agreed to proceed in this manner.   All issues noted in this document were discussed and addressed.  No physical exam could be performed with this format.  Evaluation Performed:  Preoperative cardiovascular risk assessment _____________   Date:  10/26/2023   Patient ID:  Brandon Norman, DOB April 13, 1956, MRN [id number] Patient Location:  Home Provider location:   Office  Primary Care Provider:  Paulina Fusi, MD Primary Cardiologist:  Garwin Brothers, MD  Chief Complaint / Patient Profile   68 y.o. y/o male with a h/o pulmonary embolism anticoagulated on Xarelto, DVT, type 2 diabetes, thyroid disease, CKD, dyslipidemia who is pending left total shoulder replacement on date TBD with [facility] by Dr. Jones Broom and presents today for telephonic preoperative cardiovascular risk assessment.  History of Present Illness    Brandon Norman is a 68 y.o. male who presents via audio/video conferencing for a telehealth visit today.  Pt was last seen in cardiology clinic on 06/19/2023 by [name], NP.  At that time [name] was doing well.  The patient is now pending procedure as outlined above. Since his last visit, he denies chest pain, shortness of breath, lower extremity edema, fatigue, palpitations, melena, hematuria, hemoptysis, diaphoresis, weakness, presyncope, syncope, orthopnea, and PND.  He notes that he is doing well overall.  He is without any acute cardiovascular concerns or complaints.  He notes that he does remain fairly  active.  He frequently does yard work and continues to walk for exercise.  He denies any exertional angina or dyspnea.  Past Medical History    Past Medical History:  Diagnosis Date   Acute DVT (deep venous thrombosis) (HCC) 04/03/2020   Arthritis    Basal cell carcinoma    Cancer (HCC)    Chronic kidney disease    CKD3   Diabetes mellitus due to underlying condition with unspecified complications (HCC) 07/30/2015   Dyspnea    History of colon polyps 10/01/2011   tubular adenomas   History of kidney stones    History of pulmonary embolism 02/03/2019   Hyperlipidemia 04/07/2017   Hypertension    Hypothyroidism    Peripheral vascular disease (HCC)    PE/DVT on Xarelto   Pulmonary embolism (HCC) 2015   Thyroid disease    Past Surgical History:  Procedure Laterality Date   COLONOSCOPY  11/28/2015   Colonic polyps status post polypectomy. Mild colonic diverticulosis.    COLONOSCOPY  12/26/2020   LUMBAR LAMINECTOMY/DECOMPRESSION MICRODISCECTOMY N/A 08/07/2023   Procedure: OPEN LUMBAR LAMINECTOMY, MEDIAL FACETECTOMIES LUMBAR TWO-THREE;  Surgeon: Bethann Goo, DO;  Location: [facility] OR;  Service: Neurosurgery;  Laterality: N/A;  3C   WISDOM TOOTH EXTRACTION  1992    Allergies  No Known Allergies  Home Medications    Prior to Admission medications   Medication Sig Start Date End Date Taking? Authorizing Provider  acetaminophen (TYLENOL) 325 MG tablet Take 650 mg by mouth every 6 (six) hours as needed for moderate pain (pain score 4-6).    [provider]  Cholecalciferol (VITAMIN D3) 50 MCG CAPS  Take 2,000 Units by mouth daily. 01/28/23   [provider]  ezetimibe (ZETIA) 10 MG tablet Take 10 mg by mouth daily. 08/23/22   [provider]  FARXIGA 10 MG TABS tablet Take 10 mg by mouth daily. 09/25/22   [provider]  fenofibrate 160 MG tablet Take 160 mg by mouth every evening.    [provider]  glimepiride (AMARYL) 2 MG tablet Take 2  mg by mouth daily. 01/28/19   [provider]  levothyroxine (SYNTHROID) 75 MCG tablet Take 75 mcg by mouth daily before breakfast. 11/14/19   [provider]  methocarbamol (ROBAXIN) 500 MG tablet Take 1 tablet (500 mg total) by mouth every 6 (six) hours as needed for muscle spasms. 08/08/23   Augusto Blonder, MD  Omega-3 Fatty Acids (FISH OIL) 1000 MG CAPS Take 2 capsules (2,000 mg total) by mouth 2 (two) times daily. 10/05/20   Revankar, Rajan R, MD  rivaroxaban (XARELTO) 20 MG TABS tablet Take 20 mg by mouth daily. 03/10/14   [provider]  TRULICITY 0.75 MG/0.5ML SOPN Inject 0.75 mg into the skin once a week. 09/17/20   [provider]  TRULICITY 1.5 MG/0.5ML SOAJ Inject into the skin as directed. AS DIRECTED; PT HAS NOT STARTED NEW DOSE YET Patient not taking: Reported on 09/25/2023 09/22/23   [provider]    Physical Exam    Vital Signs:  DONELLE BABA does not have vital signs available for review today.  Given telephonic nature of communication, physical exam is limited. AAOx3. NAD. Normal affect.  Speech and respirations are unlabored.  Accessory Clinical Findings    None  Assessment & Plan    1.  Preoperative Cardiovascular Risk Assessment: According to the Revised Cardiac Risk Index (RCRI), his Perioperative Risk of Major Cardiac Event is (%): 0.4. His Functional Capacity in METs is: 6.27 according to the Duke Activity Status Index (DASI). Therefore, based on ACC/AHA guidelines, patient would be at acceptable risk for the planned procedure without further cardiovascular testing.   The patient was advised that if he develops new symptoms prior to surgery to contact our office to arrange for a follow-up visit, and he verbalized understanding.  Per office protocol, patient can hold Xarelto for 3 days prior to procedure.   Patient WILL need bridging with Lovenox (enoxaparin) around procedure.   He previously stopped his DOAC for  lithotripsy in the past and developed subsequent thromboembolism. Per recommendations per Dr. Revankar (2024) plan to bridge with Lovenox.    Patient will need to reach out to our office when date is determined, and we can set up the bridging protocol.   Trulicity and Farxiga managed by PCP.  A copy of this note will be routed to requesting surgeon.  Time:   Today, I have spent 8 minutes with the patient with telehealth technology discussing medical history, symptoms, and management plan.     Ava Boatman, NP  10/26/2023, 7:25 AM

## 2023-10-26 NOTE — Addendum Note (Signed)
 Addended by: Tylia Ewell L on: 10/26/2023 09:35 AM   Modules accepted: Level of Service

## 2023-10-28 DIAGNOSIS — M25612 Stiffness of left shoulder, not elsewhere classified: Secondary | ICD-10-CM | POA: Diagnosis not present

## 2023-10-28 DIAGNOSIS — M13812 Other specified arthritis, left shoulder: Secondary | ICD-10-CM | POA: Diagnosis not present

## 2023-10-28 DIAGNOSIS — M25512 Pain in left shoulder: Secondary | ICD-10-CM | POA: Diagnosis not present

## 2023-11-03 ENCOUNTER — Other Ambulatory Visit: Payer: Self-pay | Admitting: Orthopedic Surgery

## 2023-11-03 NOTE — Telephone Encounter (Signed)
 Received another fax requesting clearance as the date is pending clearance.

## 2023-11-03 NOTE — Telephone Encounter (Signed)
   Patient Name: Brandon Norman  DOB: [birth year] MRN: [id number]  Primary Cardiologist: Nelia Balzarine, MD  Chart reviewed as part of pre-operative protocol coverage.  Patient was cleared for surgery via telephone visit on 10/26/2023.  I will route the note from most recent telephone visit addressing clearance to the requesting party via Epic fax function and remove from pre-op pool.  Please call with questions.  Jude Norton, NP 11/03/2023, 12:16 PM

## 2023-11-04 DIAGNOSIS — M25612 Stiffness of left shoulder, not elsewhere classified: Secondary | ICD-10-CM | POA: Diagnosis not present

## 2023-11-04 DIAGNOSIS — M13812 Other specified arthritis, left shoulder: Secondary | ICD-10-CM | POA: Diagnosis not present

## 2023-11-04 DIAGNOSIS — M25512 Pain in left shoulder: Secondary | ICD-10-CM | POA: Diagnosis not present

## 2023-11-05 ENCOUNTER — Telehealth: Payer: Self-pay | Admitting: Emergency Medicine

## 2023-11-05 NOTE — Telephone Encounter (Signed)
 Pt was to call [place] back with date and time of surgery 12/17/23 9:30 so he can set up levonex bridge

## 2023-11-06 ENCOUNTER — Encounter: Payer: Self-pay | Admitting: Pharmacist

## 2023-11-06 ENCOUNTER — Telehealth: Payer: Self-pay | Admitting: Pharmacist

## 2023-11-06 DIAGNOSIS — Z86711 Personal history of pulmonary embolism: Secondary | ICD-10-CM

## 2023-11-06 DIAGNOSIS — Z86718 Personal history of other venous thrombosis and embolism: Secondary | ICD-10-CM

## 2023-11-06 MED ORDER — ENOXAPARIN SODIUM 80 MG/0.8ML IJ SOSY: 80.0000 mg | PREFILLED_SYRINGE | Freq: Two times a day (BID) | INTRAMUSCULAR | 0 refills | Status: DC

## 2023-11-06 NOTE — Telephone Encounter (Signed)
-----   Message from Benancio Bracket sent at 11/06/2023 12:44 PM EDT -----  ----- Message ----- From: Maccia, Melissa D, RPH-CPP Sent: 11/06/2023  12:34 PM EDT To: Benancio Bracket, RPH-CPP  Send lovenox

## 2023-11-06 NOTE — Telephone Encounter (Signed)
 Spoke with pt. Surgery is 6/5 @ 9:30 AM. He usually takes Xarelto with breakfast. Has done the injections before. Wife is Charity fundraiser and gives them to him. Does not need apt. Will write out instructions and send via mychart.   6/2: No Xarelto, Inject 80 mg into the abdomen at 9:30 AM and 9:30 PM  6/3: No Xarelto, Inject 80 mg into the abdomen at 9:30 AM and 9:30 PM  6/4: No Xarelto, Inject 80 mg into the abdomen at 9:30 AM. No PM injection  6/5: Procedure day: No Lovenox , No Xarelto  Resume Xarelto when advised by MD  Plt: 271 43 ml/min 81 kg

## 2023-11-06 NOTE — Addendum Note (Signed)
 Addended by: Jeriann Sayres D on: 11/06/2023 12:33 PM   Modules accepted: Orders

## 2023-11-09 MED ORDER — ENOXAPARIN SODIUM 80 MG/0.8ML IJ SOSY: 80.0000 mg | PREFILLED_SYRINGE | Freq: Two times a day (BID) | INTRAMUSCULAR | 0 refills | Status: DC

## 2023-11-11 ENCOUNTER — Other Ambulatory Visit: Payer: Self-pay | Admitting: Cardiology

## 2023-11-11 DIAGNOSIS — M25512 Pain in left shoulder: Secondary | ICD-10-CM | POA: Diagnosis not present

## 2023-11-11 DIAGNOSIS — M25612 Stiffness of left shoulder, not elsewhere classified: Secondary | ICD-10-CM | POA: Diagnosis not present

## 2023-11-11 DIAGNOSIS — Z86718 Personal history of other venous thrombosis and embolism: Secondary | ICD-10-CM

## 2023-11-11 DIAGNOSIS — M13812 Other specified arthritis, left shoulder: Secondary | ICD-10-CM | POA: Diagnosis not present

## 2023-11-11 DIAGNOSIS — Z86711 Personal history of pulmonary embolism: Secondary | ICD-10-CM

## 2023-11-11 MED ORDER — ENOXAPARIN SODIUM 80 MG/0.8ML IJ SOSY: 80.0000 mg | PREFILLED_SYRINGE | Freq: Two times a day (BID) | INTRAMUSCULAR | 0 refills | Status: DC

## 2023-11-11 NOTE — Addendum Note (Signed)
 Addended by: Nusaiba Guallpa D on: 11/11/2023 11:39 AM   Modules accepted: Orders

## 2023-12-02 NOTE — Progress Notes (Signed)
 Anesthesia Review:  PCP: Alida Ion  LVO 06/05/23  Cardiologist : Palmer Bobo telephone visit- 10/26/23  Revanklar LOV 10/27/22   PPM/ ICD: Device Orders: Rep Notified:  Chest x-ray : EKG : 06/19/23 Echo : 2020 Stress test: Cardiac Cath :   Activity level:  Sleep Study/ CPAP : Fasting Blood Sugar :      / Checks Blood Sugar -- times a day:     DM - type  Dulaglutide  Amaryl -   Blood Thinner/ Instructions /Last Dose: ASA / Instructions/ Last Dose :  Xarelto-   Lovenox     08/07/23- back surgery

## 2023-12-03 NOTE — Care Plan (Signed)
 Ortho Bundle Case Management Note  Patient Details  Name: [name] MRN: [id number] Date of Birth: 11-Oct-1955  Patient will discharge to home with family. No DME needed. OPPT will be delayed until office visit follow up. Patient and MD in agreement.                     DME Arranged:    DME Agency:     HH Arranged:    HH Agency:     Additional Comments: Please contact me with any questions of if this plan should need to change.  Cornelia Dieter,  RN,BSN,MHA,CCM  [facility] Orthopaedic Specialist  [phone] 12/03/2023, 2:04 PM

## 2023-12-04 NOTE — Patient Instructions (Signed)
 SURGICAL WAITING ROOM VISITATION  Patients having surgery or a procedure may have no more than 2 support people in the waiting area - these visitors may rotate.    Children under the age of 21 must have an adult with them who is not the patient.  Due to an increase in RSV and influenza rates and associated hospitalizations, children ages 56 and under may not visit patients in [facility] hospitals.  Visitors with respiratory illnesses are discouraged from visiting and should remain at home.  If the patient needs to stay at the hospital during part of their recovery, the visitor guidelines for inpatient rooms apply. Pre-op nurse will coordinate an appropriate time for 1 support person to accompany patient in pre-op.  This support person may not rotate.    Please refer to the [facility] website for the visitor guidelines for Inpatients (after your surgery is over and you are in a regular room).       Your procedure is scheduled on:  12/17/2023    Report to [facility] Main Entrance    Report to admitting at  0515 AM   Call this number if you have problems the morning of surgery [phone]   Do not eat food :After Midnight.   After Midnight you may have the following liquids until __ 0430____ AM  DAY OF SURGERY  Water Non-Citrus Juices (without pulp, NO RED-Apple, White grape, White cranberry) Black Coffee (NO MILK/CREAM OR CREAMERS, sugar ok)  Clear Tea (NO MILK/CREAM OR CREAMERS, sugar ok) regular and decaf                             Plain Jell-O (NO RED)                                           Fruit ices (not with fruit pulp, NO RED)                                     Popsicles (NO RED)                                                               Sports drinks like Gatorade (NO RED)                    The day of surgery:  Drink ONE (1) Pre-Surgery Clear Ensure or G2 at  0430AM ( have completed by )  the morning of surgery. Drink in one sitting. Do not sip.   This drink was given to you during your hospital  pre-op appointment visit. Nothing else to drink after completing the  Pre-Surgery Clear Ensure or G2.          If you have questions, please contact your surgeon's office.      Oral Hygiene is also important to reduce your risk of infection.  Remember - BRUSH YOUR TEETH THE MORNING OF SURGERY WITH YOUR REGULAR TOOTHPASTE  DENTURES WILL BE REMOVED PRIOR TO SURGERY PLEASE DO NOT APPLY "Poly grip" OR ADHESIVES!!!   Do NOT smoke after Midnight   Stop all vitamins and herbal supplements 7 days before surgery.   Take these medicines the morning of surgery with A SIP OF WATER:  synthroid ,              Dulaglutide-            Amaryl - none am of surgery              DO NOT TAKE ANY ORAL DIABETIC MEDICATIONS DAY OF YOUR SURGERY  Bring CPAP mask and tubing day of surgery.                              You may not have any metal on your body including hair pins, jewelry, and body piercing             Do not wear make-up, lotions, powders, perfumes/cologne, or deodorant  Do not wear nail polish including gel and S&S, artificial/acrylic nails, or any other type of covering on natural nails including finger and toenails. If you have artificial nails, gel coating, etc. that needs to be removed by a nail salon please have this removed prior to surgery or surgery may need to be canceled/ delayed if the surgeon/ anesthesia feels like they are unable to be safely monitored.   Do not shave  48 hours prior to surgery.               Men may shave face and neck.   Do not bring valuables to the hospital. [facility] IS NOT             RESPONSIBLE   FOR VALUABLES.   Contacts, glasses, dentures or bridgework may not be worn into surgery.   Bring small overnight bag day of surgery.   DO NOT BRING YOUR HOME MEDICATIONS TO THE HOSPITAL. PHARMACY WILL DISPENSE MEDICATIONS LISTED ON YOUR MEDICATION LIST TO YOU DURING  YOUR ADMISSION IN THE HOSPITAL!    Patients discharged on the day of surgery will not be allowed to drive home.  Someone NEEDS to stay with you for the first 24 hours after anesthesia.   Special Instructions: Bring a copy of your healthcare power of attorney and living will documents the day of surgery if you haven't scanned them before.              Please read over the following fact sheets you were given: IF YOU HAVE QUESTIONS ABOUT YOUR PRE-OP INSTRUCTIONS PLEASE CALL [phone]   If you received a COVID test during your pre-op visit  it is requested that you wear a mask when out in public, stay away from anyone that may not be feeling well and notify your surgeon if you develop symptoms. If you test positive for Covid or have been in contact with anyone that has tested positive in the last 10 days please notify you surgeon.      Pre-operative 5 CHG Bath Instructions   You can play a key role in reducing the risk of infection after surgery. Your skin needs to be as free of germs as possible. You can reduce the number of germs on your skin by washing with CHG (chlorhexidine  gluconate) soap before surgery. CHG is an antiseptic soap that kills germs  and continues to kill germs even after washing.   DO NOT use if you have an allergy to chlorhexidine /CHG or antibacterial soaps. If your skin becomes reddened or irritated, stop using the CHG and notify one of our RNs at [phone].   Please shower with the CHG soap starting 4 days before surgery using the following schedule:     Please keep in mind the following:  DO NOT shave, including legs and underarms, starting the day of your first shower.   You may shave your face at any point before/day of surgery.  Place clean sheets on your bed the day you start using CHG soap. Use a clean washcloth (not used since being washed) for each shower. DO NOT sleep with pets once you start using the CHG.   CHG Shower Instructions:  If you choose to  wash your hair and private area, wash first with your normal shampoo/soap.  After you use shampoo/soap, rinse your hair and body thoroughly to remove shampoo/soap residue.  Turn the water OFF and apply about 3 tablespoons (45 ml) of CHG soap to a CLEAN washcloth.  Apply CHG soap ONLY FROM YOUR NECK DOWN TO YOUR TOES (washing for 3-5 minutes)  DO NOT use CHG soap on face, private areas, open wounds, or sores.  Pay special attention to the area where your surgery is being performed.  If you are having back surgery, having someone wash your back for you may be helpful. Wait 2 minutes after CHG soap is applied, then you may rinse off the CHG soap.  Pat dry with a clean towel  Put on clean clothes/pajamas   If you choose to wear lotion, please use ONLY the CHG-compatible lotions on the back of this paper.     Additional instructions for the day of surgery: DO NOT APPLY any lotions, deodorants, cologne, or perfumes.   Put on clean/comfortable clothes.  Brush your teeth.  Ask your nurse before applying any prescription medications to the skin.      CHG Compatible Lotions   Aveeno Moisturizing lotion  Cetaphil Moisturizing Cream  Cetaphil Moisturizing Lotion  Clairol Herbal Essence Moisturizing Lotion, Dry Skin  Clairol Herbal Essence Moisturizing Lotion, Extra Dry Skin  Clairol Herbal Essence Moisturizing Lotion, Normal Skin  Curel Age Defying Therapeutic Moisturizing Lotion with Alpha Hydroxy  Curel Extreme Care Body Lotion  Curel Soothing Hands Moisturizing Hand Lotion  Curel Therapeutic Moisturizing Cream, Fragrance-Free  Curel Therapeutic Moisturizing Lotion, Fragrance-Free  Curel Therapeutic Moisturizing Lotion, Original Formula  Eucerin Daily Replenishing Lotion  Eucerin Dry Skin Therapy Plus Alpha Hydroxy Crme  Eucerin Dry Skin Therapy Plus Alpha Hydroxy Lotion  Eucerin Original Crme  Eucerin Original Lotion  Eucerin Plus Crme Eucerin Plus Lotion  Eucerin TriLipid  Replenishing Lotion  Keri Anti-Bacterial Hand Lotion  Keri Deep Conditioning Original Lotion Dry Skin Formula Softly Scented  Keri Deep Conditioning Original Lotion, Fragrance Free Sensitive Skin Formula  Keri Lotion Fast Absorbing Fragrance Free Sensitive Skin Formula  Keri Lotion Fast Absorbing Softly Scented Dry Skin Formula  Keri Original Lotion  Keri Skin Renewal Lotion Keri Silky Smooth Lotion  Keri Silky Smooth Sensitive Skin Lotion  Nivea Body Creamy Conditioning Oil  Nivea Body Extra Enriched Teacher, adult education Moisturizing Lotion Nivea Crme  Nivea Skin Firming Lotion  NutraDerm 30 Skin Lotion  NutraDerm Skin Lotion  NutraDerm Therapeutic Skin Cream  NutraDerm Therapeutic Skin Lotion  ProShield Protective Hand Cream  Provon moisturizing lotion  [place]- Preparing for Total Shoulder Arthroplasty    Before surgery, you can play an important role. Because skin is not sterile, your skin needs to be as free of germs as possible. You can reduce the number of germs on your skin by using the following products. Benzoyl Peroxide Gel Reduces the number of germs present on the skin Applied twice a day to shoulder area starting two days before surgery    ==================================================================  Please follow these instructions carefully:  BENZOYL PEROXIDE 5% GEL  Please do not use if you have an allergy to benzoyl peroxide.   If your skin becomes reddened/irritated stop using the benzoyl peroxide.  Starting two days before surgery, apply as follows: Apply benzoyl peroxide in the morning and at night. Apply after taking a shower. If you are not taking a shower clean entire shoulder front, back, and side along with the armpit with a clean wet washcloth.  Place a quarter-sized dollop on your shoulder and rub in thoroughly, making sure to cover the front, back, and side of your shoulder, along with the armpit.   2  days before ____ AM   ____ PM              1 day before ____ AM   ____ PM                         Do this twice a day for two days.  (Last application is the night before surgery, AFTER using the CHG soap as described below).  Do NOT apply benzoyl peroxide gel on the day of surgery.

## 2023-12-09 ENCOUNTER — Ambulatory Visit (HOSPITAL_COMMUNITY)
Admission: RE | Admit: 2023-12-09 | Discharge: 2023-12-09 | Disposition: A | Discharge status: Home / Self Care | Source: Ambulatory Visit | Attending: Orthopedic Surgery | Admitting: Orthopedic Surgery

## 2023-12-09 ENCOUNTER — Encounter (HOSPITAL_COMMUNITY): Payer: Self-pay

## 2023-12-09 ENCOUNTER — Encounter (HOSPITAL_COMMUNITY)
Admission: RE | Admit: 2023-12-09 | Discharge: 2023-12-09 | Disposition: A | Discharge status: Home / Self Care | Source: Ambulatory Visit | Attending: Orthopedic Surgery | Admitting: Orthopedic Surgery

## 2023-12-09 ENCOUNTER — Other Ambulatory Visit: Payer: Self-pay

## 2023-12-09 VITALS — BP 138/85 | HR 79 | Temp 98.8°F | Resp 16 | Ht 68.0 in | Wt 181.0 lb

## 2023-12-09 DIAGNOSIS — Z86718 Personal history of other venous thrombosis and embolism: Secondary | ICD-10-CM | POA: Insufficient documentation

## 2023-12-09 DIAGNOSIS — E139 Other specified diabetes mellitus without complications: Secondary | ICD-10-CM

## 2023-12-09 DIAGNOSIS — N183 Chronic kidney disease, stage 3 unspecified: Secondary | ICD-10-CM | POA: Insufficient documentation

## 2023-12-09 DIAGNOSIS — Z7985 Long-term (current) use of injectable non-insulin antidiabetic drugs: Secondary | ICD-10-CM | POA: Diagnosis not present

## 2023-12-09 DIAGNOSIS — I129 Hypertensive chronic kidney disease with stage 1 through stage 4 chronic kidney disease, or unspecified chronic kidney disease: Secondary | ICD-10-CM | POA: Insufficient documentation

## 2023-12-09 DIAGNOSIS — E1122 Type 2 diabetes mellitus with diabetic chronic kidney disease: Secondary | ICD-10-CM | POA: Diagnosis not present

## 2023-12-09 DIAGNOSIS — M19012 Primary osteoarthritis, left shoulder: Secondary | ICD-10-CM | POA: Diagnosis not present

## 2023-12-09 DIAGNOSIS — E039 Hypothyroidism, unspecified: Secondary | ICD-10-CM | POA: Diagnosis not present

## 2023-12-09 DIAGNOSIS — Z86711 Personal history of pulmonary embolism: Secondary | ICD-10-CM | POA: Insufficient documentation

## 2023-12-09 DIAGNOSIS — Z7984 Long term (current) use of oral hypoglycemic drugs: Secondary | ICD-10-CM | POA: Diagnosis not present

## 2023-12-09 DIAGNOSIS — R0989 Other specified symptoms and signs involving the circulatory and respiratory systems: Secondary | ICD-10-CM | POA: Diagnosis not present

## 2023-12-09 DIAGNOSIS — Z01818 Encounter for other preprocedural examination: Secondary | ICD-10-CM | POA: Insufficient documentation

## 2023-12-09 LAB — HEMOGLOBIN A1C
Hgb A1c MFr Bld: 6.8 % — ABNORMAL HIGH (ref 4.8–5.6)
Mean Plasma Glucose: 148.46 mg/dL

## 2023-12-09 LAB — BASIC METABOLIC PANEL WITH GFR
Anion gap: 7 (ref 5–15)
BUN: 24 mg/dL — ABNORMAL HIGH (ref 8–23)
CO2: 26 mmol/L (ref 22–32)
Calcium: 9.3 mg/dL (ref 8.9–10.3)
Chloride: 106 mmol/L (ref 98–111)
Creatinine, Ser: 1.54 mg/dL — ABNORMAL HIGH (ref 0.61–1.24)
GFR, Estimated: 48 mL/min — ABNORMAL LOW (ref >60)
Glucose, Bld: 176 mg/dL — ABNORMAL HIGH (ref 70–99)
Potassium: 4.4 mmol/L (ref 3.5–5.1)
Sodium: 139 mmol/L (ref 135–145)

## 2023-12-09 LAB — CBC
HCT: 54.1 % — ABNORMAL HIGH (ref 39.0–52.0)
Hemoglobin: 17.8 g/dL — ABNORMAL HIGH (ref 13.0–17.0)
MCH: 32.3 pg (ref 26.0–34.0)
MCHC: 32.9 g/dL (ref 30.0–36.0)
MCV: 98.2 fL (ref 80.0–100.0)
Platelets: 259 10*3/uL (ref 150–400)
RBC: 5.51 MIL/uL (ref 4.22–5.81)
RDW: 12.8 % (ref 11.5–15.5)
WBC: 5.3 10*3/uL (ref 4.0–10.5)
nRBC: 0 % (ref 0.0–0.2)

## 2023-12-09 LAB — SURGICAL PCR SCREEN
MRSA, PCR: NEGATIVE
Staphylococcus aureus: NEGATIVE

## 2023-12-09 LAB — GLUCOSE, CAPILLARY: Glucose-Capillary: 191 mg/dL — ABNORMAL HIGH (ref 70–99)

## 2023-12-10 ENCOUNTER — Encounter (HOSPITAL_COMMUNITY): Payer: Self-pay

## 2023-12-10 NOTE — Progress Notes (Signed)
 Case: [id number] Date/Time: 12/17/23 0715   Procedure: ARTHROPLASTY, SHOULDER, TOTAL (Left: Shoulder)   Anesthesia type: Choice   Diagnosis: Primary osteoarthritis of left shoulder [M19.012]   Pre-op diagnosis: LEFT SHOULDER OSTEOARTHRITIS   Location: [facility] ROOM 07 / [facility]   Surgeons: Sammye Cristal, MD       DISCUSSION: Brandon Norman is a 68 yo male who presents to PAT prior to surgery above. PMH of  hx of DVT/PE on Xarelto, HTN, T2DM (A1c 6.8), hypothyroidism, CKD stage 3, arthritis.  Underwent back surgery on 08/07/23. No complications noted.   Patient follows with Cardiology for above hx. Last seen by Pattricia Bores NP on 06/19/23 for preop eval prior to back surgery. Had tele visit for pre op clearance prior to upcoming surgery and cleared with plans for Lovenox  bridge:   "Preoperative Cardiovascular Risk Assessment: According to the Revised Cardiac Risk Index (RCRI), his Perioperative Risk of Major Cardiac Event is (%): 0.4. His Functional Capacity in METs is: 6.27 according to the Duke Activity Status Index (DASI). Therefore, based on ACC/AHA guidelines, patient would be at acceptable risk for the planned procedure without further cardiovascular testing.  Per office protocol, patient can hold Xarelto for 3 days prior to procedure.   Patient WILL need bridging with Lovenox  (enoxaparin ) around procedure. He previously stopped his DOAC for lithotripsy in the past and developed subsequent thromboembolism. Per recommendations per Dr. Lafayette Pierre (2024) plan to bridge with Lovenox ."  Pt on Lovenox  bridge at the direction of cardiology PharmD (see 4/25 telephone encounter). LD Xarelto: 6/1  Last seen by PCP on 09/22/23. All issues stable at that time. Medical clearance signed that patient is cleared and low risk (scanned in media on 12/03/23)   Pt reports LD Trulicity 5/11  VS: BP 138/85   Pulse 79   Temp 37.1 C (Oral)   Resp 16   Ht 5\' 8"  (1.727 m)   Wt 82.1 kg   SpO2 97%   BMI 27.52  kg/m   PROVIDERS: Adrian Hopper, MD Cardiologist - Dr. Hillis Lu Nephrologist - DR  Peabody   LABS: Labs reviewed: Acceptable for surgery. CKD stable (all labs ordered are listed, but only abnormal results are displayed)  Labs Reviewed  HEMOGLOBIN A1C - Abnormal; Notable for the following components:      Result Value   Hgb A1c MFr Bld 6.8 (*)    All other components within normal limits  BASIC METABOLIC PANEL WITH GFR - Abnormal; Notable for the following components:   Glucose, Bld 176 (*)    BUN 24 (*)    Creatinine, Ser 1.54 (*)    GFR, Estimated 48 (*)    All other components within normal limits  CBC - Abnormal; Notable for the following components:   Hemoglobin 17.8 (*)    HCT 54.1 (*)    All other components within normal limits  GLUCOSE, CAPILLARY - Abnormal; Notable for the following components:   Glucose-Capillary 191 (*)    All other components within normal limits  SURGICAL PCR SCREEN     IMAGES: CXR 12/09/23:   FINDINGS: Lung volumes are low.The cardiomediastinal contours are normal. The lungs are clear. Pulmonary vasculature is normal. No consolidation, pleural effusion, or pneumothorax. No acute osseous abnormalities are seen.   IMPRESSION: No active cardiopulmonary disease.    EKG 06/19/23  NSR, rate 89  CV: Echo 04/07/2019:  IMPRESSIONS    1. Left ventricular ejection fraction, by visual estimation, is 60 to 65%. The left ventricle has normal  function. Normal left ventricular size. There is no left ventricular hypertrophy.  2. Global right ventricle has normal systolic function.The right ventricular size is normal. No increase in right ventricular wall thickness.  3. Left atrial size was normal.  4. Right atrial size was normal.  5. The mitral valve is normal in structure. No evidence of mitral valve regurgitation. No evidence of mitral stenosis.  6. The tricuspid valve is normal in structure. Tricuspid valve regurgitation was  not visualized by color flow Doppler.  7. The aortic valve is normal in structure. Aortic valve regurgitation was not visualized by color flow Doppler. Structurally normal aortic valve, with no evidence of sclerosis or stenosis.  8. The pulmonic valve was normal in structure. Pulmonic valve regurgitation is not visualized by color flow Doppler.  9. The inferior vena cava is normal in size with greater than 50% respiratory variability, suggesting right atrial pressure of 3 mmHg.  Past Medical History:  Diagnosis Date   Acute DVT (deep venous thrombosis) (HCC) 04/03/2020   Arthritis    Basal cell carcinoma    Cancer (HCC)    Chronic kidney disease    CKD3   Diabetes mellitus due to underlying condition with unspecified complications (HCC) 07/30/2015   Dyspnea    History of colon polyps 10/01/2011   tubular adenomas   History of kidney stones    History of pulmonary embolism 02/03/2019   Hyperlipidemia 04/07/2017   Hypothyroidism    Peripheral vascular disease (HCC)    PE/DVT on Xarelto   Pulmonary embolism (HCC) 2015   Thyroid  disease     Past Surgical History:  Procedure Laterality Date   COLONOSCOPY  11/28/2015   Colonic polyps status post polypectomy. Mild colonic diverticulosis.    COLONOSCOPY  12/26/2020   LUMBAR LAMINECTOMY/DECOMPRESSION MICRODISCECTOMY N/A 08/07/2023   Procedure: OPEN LUMBAR LAMINECTOMY, MEDIAL FACETECTOMIES LUMBAR TWO-THREE;  Surgeon: Pincus Bridgeman, DO;  Location: [facility];  Service: Neurosurgery;  Laterality: N/A;  3C   Mohs surgery for skin cancer      WISDOM TOOTH EXTRACTION  1992    MEDICATIONS:  acetaminophen  (TYLENOL ) 500 MG tablet   Cholecalciferol (VITAMIN D3 PO)   Dulaglutide 1.5 MG/0.5ML SOAJ   enoxaparin  (LOVENOX ) 80 MG/0.8ML injection   ezetimibe  (ZETIA ) 10 MG tablet   fenofibrate  160 MG tablet   glimepiride  (AMARYL ) 2 MG tablet   levothyroxine  (SYNTHROID ) 75 MCG tablet   methocarbamol  (ROBAXIN ) 500 MG tablet   Omega-3 Fatty Acids  (FISH OIL ) 1000 MG CAPS   rivaroxaban (XARELTO) 20 MG TABS tablet   traMADol (ULTRAM) 50 MG tablet   No current facility-administered medications for this encounter.   Antoinette Kirschner [facility] Short Stay/Anesthesiology [facility] Phone [phone] 12/10/2023 11:00 AM

## 2023-12-10 NOTE — Anesthesia Preprocedure Evaluation (Addendum)
 Anesthesia Evaluation  Patient identified by MRN, date of birth, ID band Patient awake    Reviewed: Allergy & Precautions, NPO status , Patient's Chart, lab work & pertinent test results  Airway Mallampati: III  TM Distance: >3 FB Neck ROM: Full    Dental no notable dental hx. (+) Dental Advisory Given, Teeth Intact   Pulmonary shortness of breath, neg recent URI   Pulmonary exam normal breath sounds clear to auscultation       Cardiovascular negative cardio ROS Normal cardiovascular exam Rhythm:Regular Rate:Normal  Echo 2020  1. Left ventricular ejection fraction, by visual estimation, is 60 to 65%. The left ventricle has normal function. Normal left ventricular size. There is no left ventricular hypertrophy.   2. Global right ventricle has normal systolic function.The right ventricular size is normal. No increase in right ventricular wall thickness.   3. Left atrial size was normal.   4. Right atrial size was normal.   5. The mitral valve is normal in structure. No evidence of mitral valve regurgitation. No evidence of mitral stenosis.   6. The tricuspid valve is normal in structure. Tricuspid valve regurgitation was not visualized by color flow Doppler.   7. The aortic valve is normal in structure. Aortic valve regurgitation was not visualized by color flow Doppler. Structurally normal aortic valve, with no evidence of sclerosis or stenosis.   8. The pulmonic valve was normal in structure. Pulmonic valve regurgitation is not visualized by color flow Doppler.   9. The inferior vena cava is normal in size with greater than 50% respiratory variability, suggesting right atrial pressure of 3 mmHg.     Neuro/Psych negative neurological ROS     GI/Hepatic negative GI ROS, Neg liver ROS,,,  Endo/Other  diabetesHypothyroidism    Renal/GU Renal disease     Musculoskeletal  (+) Arthritis ,    Abdominal   Peds   Hematology negative hematology ROS (+)   Anesthesia Other Findings   Reproductive/Obstetrics                             Anesthesia Physical Anesthesia Plan  ASA: 3  Anesthesia Plan: General   Post-op Pain Management: Tylenol  PO (pre-op)* and Regional block*   Induction: Intravenous  PONV Risk Score and Plan: 2 and Ondansetron , Dexamethasone  and Treatment may vary due to age or medical condition  Airway Management Planned: Oral ETT  Additional Equipment:   Intra-op Plan:   Post-operative Plan: Extubation in OR  Informed Consent: I have reviewed the patients History and Physical, chart, labs and discussed the procedure including the risks, benefits and alternatives for the proposed anesthesia with the patient or authorized representative who has indicated his/her understanding and acceptance.     Dental advisory given  Plan Discussed with: CRNA  Anesthesia Plan Comments: (See PAT note from 5/28  Risks of anesthesia explained at length. This includes, but is not limited to, sore throat, damage to teeth, lips gums, tongue and vocal cords, nausea and vomiting, reactions to medications, stroke, heart attack, and death. All patient questions were answered and the patient wishes to proceed. Risks of peripheral nerve block explained at length. This includes, but is not limited to, bleeding, infection, reactions to the medications, seizures, damage to surrounding structures, damage to nerves, permanent weakness, numbness, tingling and pain. All patient questions were answered and patient wishes to proceed with nerve block. )        Anesthesia Quick Evaluation

## 2023-12-17 ENCOUNTER — Ambulatory Visit (HOSPITAL_COMMUNITY): Payer: Self-pay | Admitting: Medical

## 2023-12-17 ENCOUNTER — Encounter (HOSPITAL_COMMUNITY): Payer: Self-pay | Admitting: Orthopedic Surgery

## 2023-12-17 ENCOUNTER — Other Ambulatory Visit: Payer: Self-pay

## 2023-12-17 ENCOUNTER — Ambulatory Visit (HOSPITAL_BASED_OUTPATIENT_CLINIC_OR_DEPARTMENT_OTHER): Payer: Self-pay | Admitting: Anesthesiology

## 2023-12-17 ENCOUNTER — Encounter (HOSPITAL_COMMUNITY)
Admission: RE | Disposition: A | Discharge status: Home / Self Care | Payer: Self-pay | Source: Ambulatory Visit | Attending: Orthopedic Surgery

## 2023-12-17 ENCOUNTER — Ambulatory Visit (HOSPITAL_COMMUNITY)
Admission: RE | Admit: 2023-12-17 | Discharge: 2023-12-17 | Disposition: A | Discharge status: Home / Self Care | Source: Ambulatory Visit | Attending: Orthopedic Surgery | Admitting: Orthopedic Surgery

## 2023-12-17 DIAGNOSIS — E039 Hypothyroidism, unspecified: Secondary | ICD-10-CM

## 2023-12-17 DIAGNOSIS — Z7722 Contact with and (suspected) exposure to environmental tobacco smoke (acute) (chronic): Secondary | ICD-10-CM | POA: Diagnosis not present

## 2023-12-17 DIAGNOSIS — E1122 Type 2 diabetes mellitus with diabetic chronic kidney disease: Secondary | ICD-10-CM | POA: Insufficient documentation

## 2023-12-17 DIAGNOSIS — N183 Chronic kidney disease, stage 3 unspecified: Secondary | ICD-10-CM | POA: Diagnosis not present

## 2023-12-17 DIAGNOSIS — M19012 Primary osteoarthritis, left shoulder: Secondary | ICD-10-CM | POA: Diagnosis not present

## 2023-12-17 DIAGNOSIS — G8918 Other acute postprocedural pain: Secondary | ICD-10-CM | POA: Diagnosis not present

## 2023-12-17 DIAGNOSIS — Z7984 Long term (current) use of oral hypoglycemic drugs: Secondary | ICD-10-CM | POA: Insufficient documentation

## 2023-12-17 DIAGNOSIS — Z01818 Encounter for other preprocedural examination: Secondary | ICD-10-CM

## 2023-12-17 DIAGNOSIS — Z7985 Long-term (current) use of injectable non-insulin antidiabetic drugs: Secondary | ICD-10-CM | POA: Diagnosis not present

## 2023-12-17 DIAGNOSIS — E119 Type 2 diabetes mellitus without complications: Secondary | ICD-10-CM | POA: Diagnosis not present

## 2023-12-17 HISTORY — PX: TOTAL SHOULDER ARTHROPLASTY: SHX126

## 2023-12-17 LAB — GLUCOSE, CAPILLARY
Glucose-Capillary: 170 mg/dL — ABNORMAL HIGH (ref 70–99)
Glucose-Capillary: 159 mg/dL — ABNORMAL HIGH (ref 70–99)

## 2023-12-17 SURGERY — ARTHROPLASTY, SHOULDER, TOTAL
Anesthesia: General | Site: Shoulder | Laterality: Left

## 2023-12-17 MED ORDER — PHENYLEPHRINE HCL (PRESSORS) 10 MG/ML IV SOLN
INTRAVENOUS | Status: AC
  Filled 2023-12-17: qty 1

## 2023-12-17 MED ORDER — SODIUM CHLORIDE 0.9 % IV SOLN: INTRAVENOUS | Status: DC | PRN

## 2023-12-17 MED ORDER — INSULIN ASPART 100 UNIT/ML IJ SOLN: 0.0000 [IU] | INTRAMUSCULAR | Status: DC | PRN

## 2023-12-17 MED ORDER — 0.9 % SODIUM CHLORIDE (POUR BTL) OPTIME
TOPICAL | Status: DC | PRN
  Administered 2023-12-17: 1000 mL

## 2023-12-17 MED ORDER — PHENYLEPHRINE 80 MCG/ML (10ML) SYRINGE FOR IV PUSH (FOR BLOOD PRESSURE SUPPORT)
PREFILLED_SYRINGE | INTRAVENOUS | Status: DC | PRN
  Administered 2023-12-17 (×3): 160 ug via INTRAVENOUS

## 2023-12-17 MED ORDER — HEMOSTATIC AGENTS (NO CHARGE) OPTIME
TOPICAL | Status: DC | PRN
  Administered 2023-12-17: 1

## 2023-12-17 MED ORDER — PROPOFOL 10 MG/ML IV BOLUS
INTRAVENOUS | Status: DC | PRN
Start: 2023-12-17 — End: 2023-12-17
  Administered 2023-12-17: 140 mg via INTRAVENOUS

## 2023-12-17 MED ORDER — WATER FOR IRRIGATION, STERILE IR SOLN
Status: DC | PRN
Start: 2023-12-17 — End: 2023-12-17
  Administered 2023-12-17: 2000 mL

## 2023-12-17 MED ORDER — CEFAZOLIN SODIUM-DEXTROSE 2-4 GM/100ML-% IV SOLN
2.0000 g | INTRAVENOUS | Status: AC
  Administered 2023-12-17: 2 g via INTRAVENOUS
  Filled 2023-12-17: qty 100

## 2023-12-17 MED ORDER — FENTANYL CITRATE (PF) 100 MCG/2ML IJ SOLN
INTRAMUSCULAR | Status: AC
  Filled 2023-12-17: qty 2

## 2023-12-17 MED ORDER — MIDAZOLAM HCL 2 MG/2ML IJ SOLN
INTRAMUSCULAR | Status: AC
  Filled 2023-12-17: qty 2

## 2023-12-17 MED ORDER — DROPERIDOL 2.5 MG/ML IJ SOLN: 0.6250 mg | Freq: Once | INTRAMUSCULAR | Status: DC | PRN

## 2023-12-17 MED ORDER — FENTANYL CITRATE (PF) 250 MCG/5ML IJ SOLN
INTRAMUSCULAR | Status: DC | PRN
  Administered 2023-12-17 (×2): 50 ug via INTRAVENOUS

## 2023-12-17 MED ORDER — ACETAMINOPHEN 500 MG PO TABS
1000.0000 mg | ORAL_TABLET | Freq: Once | ORAL | Status: AC
Start: 2023-12-17 — End: 2023-12-17
  Administered 2023-12-17: 1000 mg via ORAL
  Filled 2023-12-17: qty 2

## 2023-12-17 MED ORDER — CHLORHEXIDINE GLUCONATE 0.12 % MT SOLN
15.0000 mL | Freq: Once | OROMUCOSAL | Status: AC
  Administered 2023-12-17: 15 mL via OROMUCOSAL

## 2023-12-17 MED ORDER — LIDOCAINE 2% (20 MG/ML) 5 ML SYRINGE
INTRAMUSCULAR | Status: DC | PRN
Start: 2023-12-17 — End: 2023-12-17
  Administered 2023-12-17: 80 mg via INTRAVENOUS

## 2023-12-17 MED ORDER — PROPOFOL 10 MG/ML IV BOLUS
INTRAVENOUS | Status: AC
  Filled 2023-12-17: qty 20

## 2023-12-17 MED ORDER — FENTANYL CITRATE PF 50 MCG/ML IJ SOSY: 25.0000 ug | PREFILLED_SYRINGE | INTRAMUSCULAR | Status: DC | PRN

## 2023-12-17 MED ORDER — DEXAMETHASONE SODIUM PHOSPHATE 10 MG/ML IJ SOLN
INTRAMUSCULAR | Status: AC
  Filled 2023-12-17: qty 1

## 2023-12-17 MED ORDER — BUPIVACAINE LIPOSOME 1.3 % IJ SUSP
INTRAMUSCULAR | Status: DC | PRN
Start: 2023-12-17 — End: 2023-12-17
  Administered 2023-12-17: 10 mL via PERINEURAL

## 2023-12-17 MED ORDER — LACTATED RINGERS IV SOLN: INTRAVENOUS | Status: DC

## 2023-12-17 MED ORDER — ONDANSETRON HCL 4 MG/2ML IJ SOLN
INTRAMUSCULAR | Status: DC | PRN
  Administered 2023-12-17: 4 mg via INTRAVENOUS

## 2023-12-17 MED ORDER — SUGAMMADEX SODIUM 200 MG/2ML IV SOLN
INTRAVENOUS | Status: DC | PRN
  Administered 2023-12-17: 200 mg via INTRAVENOUS

## 2023-12-17 MED ORDER — ORAL CARE MOUTH RINSE: 15.0000 mL | Freq: Once | OROMUCOSAL | Status: AC

## 2023-12-17 MED ORDER — PHENYLEPHRINE HCL-NACL 20-0.9 MG/250ML-% IV SOLN
INTRAVENOUS | Status: DC | PRN
Start: 2023-12-17 — End: 2023-12-17
  Administered 2023-12-17: 35 ug/min via INTRAVENOUS

## 2023-12-17 MED ORDER — TRANEXAMIC ACID-NACL 1000-0.7 MG/100ML-% IV SOLN
1000.0000 mg | INTRAVENOUS | Status: AC
  Administered 2023-12-17: 1000 mg via INTRAVENOUS
  Filled 2023-12-17: qty 100

## 2023-12-17 MED ORDER — METHOCARBAMOL 500 MG PO TABS: 500.0000 mg | ORAL_TABLET | Freq: Four times a day (QID) | ORAL | 0 refills | Status: DC | PRN

## 2023-12-17 MED ORDER — MIDAZOLAM HCL 5 MG/5ML IJ SOLN
INTRAMUSCULAR | Status: DC | PRN
  Administered 2023-12-17: 2 mg via INTRAVENOUS

## 2023-12-17 MED ORDER — ROCURONIUM BROMIDE 50 MG/5ML IV SOSY
PREFILLED_SYRINGE | INTRAVENOUS | Status: DC | PRN
  Administered 2023-12-17: 60 mg via INTRAVENOUS

## 2023-12-17 MED ORDER — SODIUM CHLORIDE 0.9 % IR SOLN
Status: DC | PRN
  Administered 2023-12-17: 1000 mL

## 2023-12-17 MED ORDER — BUPIVACAINE HCL (PF) 0.5 % IJ SOLN
INTRAMUSCULAR | Status: DC | PRN
  Administered 2023-12-17: 10 mL via PERINEURAL

## 2023-12-17 MED ORDER — OXYCODONE HCL 5 MG PO TABS: 5.0000 mg | ORAL_TABLET | ORAL | 0 refills | Status: DC | PRN

## 2023-12-17 MED ORDER — ONDANSETRON HCL 4 MG/2ML IJ SOLN
INTRAMUSCULAR | Status: AC
  Filled 2023-12-17: qty 2

## 2023-12-17 MED ORDER — DEXAMETHASONE SODIUM PHOSPHATE 10 MG/ML IJ SOLN
INTRAMUSCULAR | Status: DC | PRN
  Administered 2023-12-17: 4 mg via INTRAVENOUS

## 2023-12-17 SURGICAL SUPPLY — 59 items
BAG COUNTER SPONGE SURGICOUNT (BAG) IMPLANT
BAG ZIPLOCK 12X15 (MISCELLANEOUS) ×1 IMPLANT
BIT DRILL 2.0X128 (BIT) IMPLANT
BLADE SAW SGTL 73X25 THK (BLADE) ×1 IMPLANT
CEMENT BONE DEPUY (Cement) ×1 IMPLANT
COOLER ICEMAN CLASSIC (MISCELLANEOUS) IMPLANT
COVER BACK TABLE 60X90IN (DRAPES) ×1 IMPLANT
COVER SURGICAL LIGHT HANDLE (MISCELLANEOUS) ×1 IMPLANT
DRAPE INCISE IOBAN 66X45 STRL (DRAPES) ×1 IMPLANT
DRAPE POUCH INSTRU U-SHP 10X18 (DRAPES) ×1 IMPLANT
DRAPE SHEET LG 3/4 BI-LAMINATE (DRAPES) IMPLANT
DRAPE SURG 17X11 SM STRL (DRAPES) ×1 IMPLANT
DRAPE SURG ORHT 6 SPLT 77X108 (DRAPES) ×2 IMPLANT
DRAPE TOP 10253 STERILE (DRAPES) ×1 IMPLANT
DRAPE U-SHAPE 47X51 STRL (DRAPES) ×1 IMPLANT
DRSG AQUACEL AG ADV 3.5X 6 (GAUZE/BANDAGES/DRESSINGS) ×1 IMPLANT
DURAPREP 26ML APPLICATOR (WOUND CARE) ×2 IMPLANT
ELECT BLADE TIP CTD 4 INCH (ELECTRODE) ×1 IMPLANT
ELECT PENCIL ROCKER SW 15FT (MISCELLANEOUS) ×1 IMPLANT
ELECT REM PT RETURN 15FT ADLT (MISCELLANEOUS) ×1 IMPLANT
GLENOID PEGGED CORTILOC M40 (Orthopedic Implant) IMPLANT
GLOVE BIO SURGEON STRL SZ7.5 (GLOVE) ×1 IMPLANT
GLOVE BIOGEL PI IND STRL 6.5 (GLOVE) ×1 IMPLANT
GLOVE BIOGEL PI IND STRL 8 (GLOVE) ×1 IMPLANT
GLOVE SURG POLYISO LF SZ6.5 (GLOVE) ×1 IMPLANT
GOWN STRL REUS W/ TWL LRG LVL3 (GOWN DISPOSABLE) IMPLANT
GOWN STRL REUS W/ TWL XL LVL3 (GOWN DISPOSABLE) ×1 IMPLANT
GUIDEWIRE GLENOID 2.5X220 (WIRE) IMPLANT
HEAD HUMERAL AEQUALIS 52 SHLDR (Miscellaneous) IMPLANT
HEMOSTAT SURGICEL 2X14 (HEMOSTASIS) ×1 IMPLANT
HOOD PEEL AWAY T7 (MISCELLANEOUS) ×3 IMPLANT
KIT BASIN OR (CUSTOM PROCEDURE TRAY) ×1 IMPLANT
KIT TURNOVER KIT A (KITS) IMPLANT
MANIFOLD NEPTUNE II (INSTRUMENTS) ×1 IMPLANT
NDL TROCAR POINT SZ 2 1/2 (NEEDLE) ×1 IMPLANT
NEEDLE TROCAR POINT SZ 2 1/2 (NEEDLE) ×1 IMPLANT
NS IRRIG 1000ML POUR BTL (IV SOLUTION) ×1 IMPLANT
PACK SHOULDER (CUSTOM PROCEDURE TRAY) ×1 IMPLANT
PAD COLD SHLDR WRAP-ON (PAD) IMPLANT
PROTECTOR NERVE ULNAR (MISCELLANEOUS) IMPLANT
RESTRAINT HEAD UNIVERSAL NS (MISCELLANEOUS) ×1 IMPLANT
RETRIEVER SUT HEWSON (MISCELLANEOUS) ×1 IMPLANT
SET HNDPC FAN SPRY TIP SCT (DISPOSABLE) ×1 IMPLANT
SLING ARM FOAM STRAP LRG (SOFTGOODS) IMPLANT
SLING ARM IMMOBILIZER LRG (SOFTGOODS) IMPLANT
SPONGE T-LAP 4X18 ~~LOC~~+RFID (SPONGE) ×2 IMPLANT
STEM HUMERAL AEQUALIS 70XS2C (Stem) IMPLANT
STRIP CLOSURE SKIN 1/2X4 (GAUZE/BANDAGES/DRESSINGS) ×1 IMPLANT
SUCTION TUBE FRAZIER 12FR DISP (SUCTIONS) ×1 IMPLANT
SUPPORT WRAP ARM LG (MISCELLANEOUS) ×1 IMPLANT
SUT ETHIBOND 2 V 37 (SUTURE) ×1 IMPLANT
SUT MNCRL AB 4-0 PS2 18 (SUTURE) ×1 IMPLANT
SUT VIC AB 2-0 CT1 TAPERPNT 27 (SUTURE) ×2 IMPLANT
TAPE LABRALWHITE 1.5X36 (TAPE) ×1 IMPLANT
TAPE SUT LABRALTAP WHT/BLK (SUTURE) ×1 IMPLANT
TOWEL OR 17X26 10 PK STRL BLUE (TOWEL DISPOSABLE) ×1 IMPLANT
TOWER SMARTMIX MINI (MISCELLANEOUS) ×1 IMPLANT
TUBE SUCTION HIGH CAP CLEAR NV (SUCTIONS) IMPLANT
WATER STERILE IRR 1000ML POUR (IV SOLUTION) ×1 IMPLANT

## 2023-12-17 NOTE — H&P (Addendum)
 Brandon Norman is an 68 y.o. male.   Chief Complaint: L shoulder pain and dysfunction HPI: Endstage L shoulder arthritis with significant pain and dysfunction, failed conservative measures.  Pain interferes with sleep and quality of life.   Past Medical History:  Diagnosis Date   Acute DVT (deep venous thrombosis) (HCC) 04/03/2020   Arthritis    Basal cell carcinoma    Cancer (HCC)    Chronic kidney disease    CKD3   Diabetes mellitus due to underlying condition with unspecified complications (HCC) 07/30/2015   Dyspnea    History of colon polyps 10/01/2011   tubular adenomas   History of kidney stones    History of pulmonary embolism 02/03/2019   Hyperlipidemia 04/07/2017   Hypothyroidism     Past Surgical History:  Procedure Laterality Date   COLONOSCOPY  11/28/2015   Colonic polyps status post polypectomy. Mild colonic diverticulosis.    COLONOSCOPY  12/26/2020   LUMBAR LAMINECTOMY/DECOMPRESSION MICRODISCECTOMY N/A 08/07/2023   Procedure: OPEN LUMBAR LAMINECTOMY, MEDIAL FACETECTOMIES LUMBAR TWO-THREE;  Surgeon: Pincus Bridgeman, DO;  Location: [facility];  Service: Neurosurgery;  Laterality: N/A;  3C   Mohs surgery for skin cancer      WISDOM TOOTH EXTRACTION  1992    Family History  Problem Relation Age of Onset   Heart attack Mother    Heart disease Mother    Thyroid  disease Sister    Thyroid  disease Sister    Colon cancer Neg Hx    Esophageal cancer Neg Hx    Rectal cancer Neg Hx    Stomach cancer Neg Hx    Social History:  reports that he has never smoked. He has been exposed to tobacco smoke. He has never used smokeless tobacco. He reports that he does not drink alcohol and does not use drugs.  Allergies:  Allergies  Allergen Reactions   Other     NO NSAIDS per pt due to Kidney  Disease    Hibiclens  [Chlorhexidine  Gluconate] Rash    PT states developed rash after use 07/2023 with back surgery.     Medications Prior to Admission  Medication Sig Dispense Refill    acetaminophen  (TYLENOL ) 500 MG tablet Take 500-1,000 mg by mouth every 6 (six) hours as needed (pain.).     Cholecalciferol (VITAMIN D3 PO) Take 5,000 Units by mouth in the morning.     Dulaglutide 1.5 MG/0.5ML SOAJ Inject 1.5 mg into the skin every Sunday.     enoxaparin  (LOVENOX ) 80 MG/0.8ML injection Inject 0.8 mLs (80 mg total) into the skin every 12 (twelve) hours. 0.8 mL 0   ezetimibe  (ZETIA ) 10 MG tablet Take 10 mg by mouth every evening.     fenofibrate  160 MG tablet Take 160 mg by mouth every evening.     glimepiride  (AMARYL ) 2 MG tablet Take 2 mg by mouth every evening.     levothyroxine  (SYNTHROID ) 75 MCG tablet Take 75 mcg by mouth daily before breakfast.     methocarbamol  (ROBAXIN ) 500 MG tablet Take 1 tablet (500 mg total) by mouth every 6 (six) hours as needed for muscle spasms. 90 tablet 0   Omega-3 Fatty Acids (FISH OIL ) 1000 MG CAPS Take 2 capsules (2,000 mg total) by mouth 2 (two) times daily. 180 capsule 12   rivaroxaban (XARELTO) 20 MG TABS tablet Take 20 mg by mouth daily with breakfast.     traMADol (ULTRAM) 50 MG tablet Take 50 mg by mouth every 6 (six) hours as needed (pain.).  Results for orders placed or performed during the hospital encounter of 12/17/23 (from the past 48 hours)  Glucose, capillary     Status: Abnormal   Collection Time: 12/17/23  5:42 AM  Result Value Ref Range   Glucose-Capillary 170 (H) 70 - 99 mg/dL    Comment: Glucose reference range applies only to samples taken after fasting for at least 8 hours.   Comment 1 Notify RN    Comment 2 Document in Chart    No results found.  Review of Systems  All other systems reviewed and are negative.   Blood pressure (!) 157/86, pulse 80, temperature 98.1 F (36.7 C), temperature source Oral, resp. rate 17, SpO2 94%. Physical Exam Constitutional:      Appearance: He is well-developed.  HENT:     Head: Atraumatic.  Eyes:     Extraocular Movements: Extraocular movements intact.   Cardiovascular:     Pulses: Normal pulses.  Pulmonary:     Effort: Pulmonary effort is normal.  Musculoskeletal:     Comments: L shoulder pain with limited ROM. NVID  Skin:    General: Skin is warm and dry.  Neurological:     Mental Status: He is alert and oriented to person, place, and time.  Psychiatric:        Mood and Affect: Mood normal.      Assessment/Plan L shoulder endstage arthritis Plan L TSA Risks / benefits of surgery discussed Consent on chart  NPO for OR Preop antibiotics   Audrene Blessing, MD 12/17/2023, 6:23 AM

## 2023-12-17 NOTE — Discharge Instructions (Addendum)
 Discharge Instructions after Total Shoulder Arthroplasty   A sling has been provided for you. Remove the sling 5 times each day to perform motion exercises. After the first 48 to 72 hours, discontinue using the sling. You should use the sling as a protective device, if you are in a crowd.  Use ice on the shoulder intermittently over the first 48 hours after surgery.  Pain medication has been prescribed for you.  Use your medication liberally over the first 48 hours, and then begin to taper your use. You may take Extra Strength Tylenol  or Tylenol  only in place of the pain pills. DO NOT take ANY nonsteroidal anti-inflammatory pain medications: Advil, Motrin, Ibuprofen, Aleve, Naproxen, or Naprosyn. Resume blood thinners (Lovenox  and Xarelto) the day after surgery Leave your dressing on until your first follow up visit.  You may shower with the dressing.  Hold your arm as if you still have your sling on while you shower. Active reaching and lifting are not permitted. You may use the operative arm for activities of daily living that do not require the operative arm to leave the side of the body, such as eating, drinking, bathing, etc.  Three to 5 times each day you should perform assisted overhead reaching and external rotation (outward turning) exercises with the operative arm. You were taught these exercises prior to discharge. Both exercises should be done with the non-operative arm used as the "therapist arm" while the operative arm remains relaxed. Ten of each exercise should be done three to five times each day.   Overhead reach is helping to lift your stiff arm up as high as it will go. To stretch your overhead reach, lie flat on your back, relax, and grasp the wrist of the tight shoulder with your opposite hand. Using the power in your opposite arm, bring the stiff arm up as far as it is comfortable. Start holding it for ten seconds and then work up to where you can hold it for a count of 30.  Breathe slowly and deeply while the arm is moved. Repeat this stretch ten times, trying to help the ar up a little higher each time.     External rotation is turning the arm out to the side while your elbow stays close to your body. External rotation is best stretched while you are lying on your back. Hold a cane, yardstick, broom handle, or dowel in both hands. Bend both elbows to a right angle. Use steady, gentle force from your normal arm to rotate the hand of the stiff shoulder out away from your body. Continue the rotation until it is straight in front of you holding it there for a count of 10. Do not go beyond this level of rotation until seen back by Dr. Deeann Fare. Repeat this exercise ten times slowly.      Please call [phone] during normal business hours or [phone] after hours for any problems. Including the following:  - excessive redness of the incisions - drainage for more than 4 days - fever of more than 101.5 F  *Please note that pain medications will not be refilled after hours or on weekends.  Dental Antibiotics:  In most cases prophylactic antibiotics for Dental procdeures after total joint surgery are not necessary.  Exceptions are as follows:  1. History of prior total joint infection  2. Severely immunocompromised (Organ Transplant, cancer chemotherapy, Rheumatoid biologic meds such as Humera)  3. Poorly controlled diabetes (A1C &gt; 8.0, blood glucose over 200)  If you have one of these conditions, contact your surgeon for an antibiotic prescription, prior to your dental procedure.

## 2023-12-17 NOTE — Op Note (Signed)
 Procedure(s): ARTHROPLASTY, SHOULDER, TOTAL Procedure Note  Brandon Norman male 68 y.o. 12/17/2023  Preoperative diagnosis: Left shoulder end-stage osteoarthritis  Postoperative diagnosis: Same  Procedure(s) and Anesthesia Type:    * ARTHROPLASTY, SHOULDER, TOTAL - General  Surgeons and Role:    Sammye Cristal, MD - Primary   Indications:  68 y.o. male  With endstage left shoulder arthritis. Pain and dysfunction interfered with quality of life and nonoperative treatment with activity modification, NSAIDS and injections failed.     Surgeon: Audrene Blessing   Assistants: Sidra Dredge PA-C Amber was present and scrubbed throughout the procedure and was essential in positioning, retraction, exposure, and closure)  Anesthesia: General endotracheal anesthesia with preoperative interscalene block given by the attending anesthesiologist    Procedure Detail  ARTHROPLASTY, SHOULDER, TOTAL  Findings: Tornier flex anatomic press-fit size 2 stem with a 52 x 19 head, cemented size 40 medium Cortiloc glenoid.   A lesser tuberosity osteotomy was performed and repaired at the conclusion of the procedure.  Estimated Blood Loss:  200 mL         Drains: None   Blood Given: none          Specimens: none        Complications:  * No complications entered in OR log *         Disposition: PACU - hemodynamically stable.         Condition: stable    Procedure:   The patient was identified in the preoperative holding area where I personally marked the operative extremity after verifying with the patient and consent. He  was taken to the operating room where He was transferred to the   operative table.  The patient received an interscalene block in   the holding area by the attending anesthesiologist.  General anesthesia was induced   in the operating room without complication.  The patient did receive IV  Ancef  prior to the commencement of the procedure.  The patient was    placed in the beach-chair position with the back raised about 30   degrees.  The nonoperative extremity and head and neck were carefully   positioned and padded protecting against neurovascular compromise.  The   left upper extremity was then prepped and draped in the standard sterile   fashion.    The appropriate operative time-out was performed with   Anesthesia, the perioperative staff, as well as myself and we all agreed   that the left side was the correct operative site.  An approximately   10 cm incision was made from the tip of the coracoid to the center point of the   humerus at the level of the axilla.  Dissection was carried down sharply   through subcutaneous tissues and cephalic vein was identified and taken   laterally with the deltoid.  The pectoralis major was taken medially.  The   upper 1 cm of the pectoralis major was released from its attachment on   the humerus.  The clavipectoral fascia was incised just lateral to the   conjoined tendon.  This incision was carried up to but not into the   coracoacromial ligament.  Digital palpation was used to prove   integrity of the axillary nerve which was protected throughout the   procedure.  Musculocutaneous nerve was not palpated in the operative   field.  Conjoined tendon was then retracted gently medially and the   deltoid laterally.  Anterior circumflex humeral vessels were clamped  and   coagulated.  The soft tissues overlying the biceps was incised and this   incision was carried across the transverse humeral ligament to the base   of the coracoid.  The biceps was noted to be severely degenerated. It was released from the superior labrum. The biceps was then tenodesed to the soft tissue just above   pectoralis major and the remaining portion of the biceps superiorly was   excised.  An osteotomy was performed at the lesser tuberosity.  The capsule was then   released all the way down to the 6 o'clock position of the humeral  head.   The humeral head was then delivered with simultaneous adduction,   extension and external rotation.  All humeral osteophytes were removed   and the anatomic neck of the humerus was marked and cut free hand at   approximately 25 degrees retroversion within about 3 mm of the cuff   reflection posteriorly.  The head size was estimated to be a 52 medium   offset.  At that point, the humeral head was retracted posteriorly with   a Fukuda retractor.   Remaining portion of the capsule was released at the base of the   coracoid.  The remaining biceps anchor and the entire anterior-inferior   labrum was excised.  The posterior labrum was also excised but the   posterior capsule was not released.  The guidepin was placed bicortically with non elevated guide.  The reamer was used to ream to concentric bone with punctate bleeding.  This gave an excellent concentric surface.  The center hole was then drilled for an anchor peg glenoid followed by the three peripheral holes and none of the holes   exited the glenoid wall.  I then pulse irrigated these holes and dried   them with Surgicel.  The three peripheral holes were then   pressurized cemented and the anchor peg glenoid was placed and impacted   with an excellent fit.  The glenoid was a 40 medium component.  The proximal humerus was then again exposed taking care not to displace the glenoid.    The entry awl was used followed by sounding reamers and then sequentially broached from size 1-2. This was then left in place and the calcar planer was used. Trial head was placed with a 52 x 19.  With the trial implantation of the component,  there was approximately 50% posterior translation with immediate snap back to the   anatomic position.  With forward elevation, there was no tendency   towards posterior subluxation.   The trial was removed and the final implant was prepared on a back table.  The trial was removed and the final implant was prepared on a  back table.   3 small holes were drilled on the medial side of the lesser tuberosity osteotomy, through which 2 labral tapes were passed. The implant was then placed through the loop of the 2 labral tapes and impacted with an excellent press-fit. This achieved excellent anatomic reconstruction of the proximal humerus.  The joint was then copiously irrigated with pulse lavage.  The subscapularis and   lesser tuberosity osteotomy were then repaired using the 2 labral tapes previously passed in a double row fashion with horizontal mattress sutures medially brought over through bone tunnels tied over a bone bridge laterally.   One #1 Ethibond was placed at the rotator interval just above   the lesser tuberosity. Copious irrigation was used. Skin was closed with 2-0  Vicryl sutures in the deep dermal layer and 4-0 Monocryl in a subcuticular  running fashion.  Sterile dressings were then applied including Aquacel.  The patient was placed in a sling and allowed to awaken from general anesthesia and taken to the recovery room in stable condition.      POSTOPERATIVE PLAN:  Early passive range of motion will be allowed with the goal of 0 degrees external rotation and 90 degrees forward elevation.  No internal rotation at this time.  No active motion of the arm until the lesser tuberosity heals.  The patient will be observed in the recovery room and if pain is well-controlled with regional anesthesia and he is hemodynamically stable he can be discharged home today with family.

## 2023-12-17 NOTE — Anesthesia Procedure Notes (Signed)
 Procedure Name: Intubation Date/Time: 12/17/2023 7:35 AM  Performed by: Melodee Spruce, CRNAPre-anesthesia Checklist: Patient identified, Emergency Drugs available, Suction available and Patient being monitored Patient Re-evaluated:Patient Re-evaluated prior to induction Oxygen Delivery Method: Circle system utilized Preoxygenation: Pre-oxygenation with 100% oxygen Induction Type: IV induction Ventilation: Mask ventilation without difficulty Laryngoscope Size: Miller and 3 Grade View: Grade I Tube type: Oral Tube size: 7.5 mm Number of attempts: 1 Airway Equipment and Method: Stylet Placement Confirmation: ETT inserted through vocal cords under direct vision, positive ETCO2 and breath sounds checked- equal and bilateral Secured at: 22 cm Tube secured with: Tape Dental Injury: Teeth and Oropharynx as per pre-operative assessment

## 2023-12-17 NOTE — Transfer of Care (Signed)
 Immediate Anesthesia Transfer of Care Note  Patient: Brandon Norman  Procedure(s) Performed: ARTHROPLASTY, SHOULDER, TOTAL (Left: Shoulder)  Patient Location: PACU  Anesthesia Type:GA combined with regional for post-op pain  Level of Consciousness: drowsy and patient cooperative  Airway & Oxygen Therapy: Patient Spontanous Breathing and Patient connected to face mask oxygen  Post-op Assessment: Report given to RN and Post -op Vital signs reviewed and stable  Post vital signs: Reviewed and stable  Last Vitals:  Vitals Value Taken Time  BP 146/95 12/17/23 0911  Temp    Pulse 73 12/17/23 0913  Resp 22 12/17/23 0913  SpO2 100 % 12/17/23 0913  Vitals shown include unfiled device data.  Last Pain:  Vitals:   12/17/23 0532  TempSrc: Oral         Complications: No notable events documented.

## 2023-12-17 NOTE — Anesthesia Procedure Notes (Signed)
 Anesthesia Regional Block: Interscalene brachial plexus block   Pre-Anesthetic Checklist: , timeout performed,  Correct Patient, Correct Site, Correct Laterality,  Correct Procedure, Correct Position, site marked,  Risks and benefits discussed,  Surgical consent,  Pre-op evaluation,  At surgeon's request and post-op pain management  Laterality: Upper and Left  Prep: chloraprep       Needles:  Injection technique: Single-shot  Needle Type: Stimulator Needle - 40     Needle Length: 4cm  Needle Gauge: 22     Additional Needles:   Procedures:,,,, ultrasound used (permanent image in chart),,    Narrative:  Start time: 12/17/2023 6:54 AM End time: 12/17/2023 7:14 AM Injection made incrementally with aspirations every 5 mL.  Performed by: Personally  Anesthesiologist: Gorman Laughter, MD  Additional Notes: BP cuff, SpO2 and EKG monitors applied. Sedation begun. Nerve location verified with ultrasound. Anesthetic injected incrementally, slowly, and after neg aspirations under direct u/s guidance. Good perineural spread. Tolerated well.

## 2023-12-17 NOTE — Evaluation (Signed)
 Occupational Therapy Evaluation Patient Details Name: Brandon Norman MRN: [id number] DOB: [birth year] Today's Date: 12/17/2023   History of Present Illness   Patient is a 68 yo male s/p L TSA 12/17/23. Early passive range of motion will be allowed with the goal of 0 degrees external rotation and 90 degrees forward elevation.  No internal rotation at this time.  No active motion of the arm until the lesser tuberosity heals.     Clinical Impressions PTA pt lives at home with wife and was independent prior to surgery this am.  Education completed regarding compensatory strategies for ADL tasks and functional mobility, management of sling, L ROM per specified parameters in the order set as indicated below, positioning of operative arm in sitting and supine and edema control, including use of "Iceman" Cold Therapy machine. Caregiver present for education, written handouts provided and reviewed using Teach Back and pt/caregiver verbalized/demonstrated understanding. Due to the below listed deficits, pt requires min assistance with ADL tasks and mod I assist with functional mobility on level surfaces. Caregiver will be able to provide necessary level of assistance at discharge. Pt to follow up with MD to progress rehab of the operative shoulder.      If plan is discharge home, recommend the following:   A little help with bathing/dressing/bathroom;Assistance with cooking/housework;Assist for transportation;Help with stairs or ramp for entrance     Functional Status Assessment   Patient has had a recent decline in their functional status and demonstrates the ability to make significant improvements in function in a reasonable and predictable amount of time.     Equipment Recommendations   None recommended by OT      Precautions/Restrictions   Precautions Precautions: Shoulder No shoulder ROM allowed except PROM 0-90 and no shoulder ext rotation,; elbow/wrist/hand AROM only.   Required  Braces or Orthoses: Sling Restrictions Weight Bearing Restrictions Per Provider Order: Yes LUE Weight Bearing Per Provider Order: Non weight bearing     Mobility Bed Mobility Overal bed mobility:  (up in recliner, educ re bed positioning completed)                  Transfers Overall transfer level: Modified independent                 General transfer comment: sling in place on L UE      Balance Overall balance assessment: No apparent balance deficits (not formally assessed)                                         ADL either performed or assessed with clinical judgement   ADL Overall ADL's : Needs assistance/impaired    Per orders, L shoulder parameters as follows for ADL tasks:No shoulder ROM except PROM 0-90 as per orders, no sh ext rotation; elbow/wrist/hand ROM only. While moving within specified parameters, pt/caregiver instructed on bathing and how to donn/doff shirt, placing operative arm through sleeve first when donning and off last when doffing.Pt/caregiver educated on compensatory strategies for LB ADL and strategies to reduce risk of falls.  Pt/caregiver educated on donning/doffing sling and to wear the sling at all times with the exception of ADL, and to loosen the neck strap of the sling when the operative arm is in a supported position when sitting. In sitting or supine, pt instructed to have a pillow behind and under their operative arm to provide support.  If assist needed with ambulation, caregiver educated on the importance of walking on pt's non-operative side.  Education regarding use of "IceMan" Cold Therapy completed, including the importance of using a barrier on the shoulder prior to positioning the wrap-on pad. Pt/caregiver verbalized/demonstrated understanding. Teach Back used while caregiver assisted with dressing pt and positioning "wrap-on pad" to facilitate DC.;                                         Vision  Baseline Vision/History: 0 No visual deficits              Pertinent Vitals/Pain Pain Assessment Pain Assessment: No/denies pain     Extremity/Trunk Assessment Upper Extremity Assessment Upper Extremity Assessment: LUE deficits/detail;Right hand dominant LUE Deficits / Details: AAROM WFL elbow wrist and hand with L UE n block still active post op   Lower Extremity Assessment Lower Extremity Assessment: Overall WFL for tasks assessed   Cervical / Trunk Assessment Cervical / Trunk Assessment: Normal   Communication Communication Communication: No apparent difficulties   Cognition Arousal: Alert Behavior During Therapy: WFL for tasks assessed/performed Cognition: No apparent impairments                               Following commands: Intact       Cueing  General Comments   Cueing Techniques: Verbal cues  L post op shoulder dressing in place and Ice Man wrap applied   Exercises Exercises: Shoulder Shoulder Exercises Shoulder Flexion: PROM, 10 reps (as per MD order 0-90 only) Elbow Flexion: AAROM, 10 reps Elbow Extension: AAROM, 10 reps Wrist Flexion: AAROM, 10 reps Wrist Extension: AAROM, 10 reps Digit Composite Flexion: AAROM, 10 reps Composite Extension: AAROM, 10 reps   Shoulder Instructions Shoulder Instructions Donning/doffing shirt without moving shoulder: Caregiver independent with task;Patient able to independently direct caregiver;Independent Method for sponge bathing under operated UE: Caregiver independent with task;Patient able to independently direct caregiver Donning/doffing sling/immobilizer: Caregiver independent with task;Patient able to independently direct caregiver Correct positioning of sling/immobilizer: Caregiver independent with task;Patient able to independently direct caregiver ROM for elbow, wrist and digits of operated UE: Independent Sling wearing schedule (on at all times/off for ADL's): Caregiver independent with  task;Patient able to independently direct caregiver Proper positioning of operated UE when showering: Caregiver independent with task;Patient able to independently direct caregiver Positioning of UE while sleeping: Caregiver independent with task;Patient able to independently direct caregiver    Home Living Family/patient expects to be discharged to:: Private residence Living Arrangements: Spouse/significant other Available Help at Discharge: Family Type of Home: House Home Access: Stairs to enter Secretary/administrator of Steps: 2 Entrance Stairs-Rails: None Home Layout: One level     Bathroom Shower/Tub: Producer, television/film/video: Standard Bathroom Accessibility: Yes How Accessible: Accessible via wheelchair Home Equipment: Grab bars - toilet;Grab bars - tub/shower;Hand held shower head;Shower seat          Prior Functioning/Environment Prior Level of Function : Independent/Modified Independent;Driving                    OT Problem List: Impaired UE functional use    AM-PAC OT "6 Clicks" Daily Activity     Outcome Measure Help from another person eating meals?: None Help from another person taking care of personal grooming?: None Help from another person toileting,  which includes using toliet, bedpan, or urinal?: A Little Help from another person bathing (including washing, rinsing, drying)?: A Little Help from another person to put on and taking off regular upper body clothing?: A Little Help from another person to put on and taking off regular lower body clothing?: A Little 6 Click Score: 20   End of Session Equipment Utilized During Treatment: Gait belt Nurse Communication: Mobility status  Activity Tolerance: Patient tolerated treatment well Patient left: in chair;with call bell/phone within reach;with family/visitor present  OT Visit Diagnosis: Other (comment) (UE dsysfuntion- L)                Time: 1005-1040 OT Time Calculation (min): 35  min Charges:  OT General Charges $OT Visit: 1 Visit OT Evaluation $OT Eval Low Complexity: 1 Low OT Treatments $Self Care/Home Management : 8-22 mins  Magen Suriano OT/L Acute Rehabilitation Department  [phone]  12/17/2023, 11:02 AM

## 2023-12-17 NOTE — Anesthesia Postprocedure Evaluation (Signed)
 Anesthesia Post Note  Patient: Brandon Norman  Procedure(s) Performed: ARTHROPLASTY, SHOULDER, TOTAL (Left: Shoulder)     Patient location during evaluation: PACU Anesthesia Type: General Level of consciousness: sedated and patient cooperative Pain management: pain level controlled Vital Signs Assessment: post-procedure vital signs reviewed and stable Respiratory status: spontaneous breathing Cardiovascular status: stable Anesthetic complications: no   No notable events documented.  Last Vitals:  Vitals:   12/17/23 0947 12/17/23 1000  BP: 130/79 122/77  Pulse: 73 68  Resp: 13   Temp: (!) 36.4 C   SpO2: 96% 93%    Last Pain:  Vitals:   12/17/23 0947  TempSrc: Oral  PainSc: 0-No pain                 Gorman Laughter

## 2023-12-18 ENCOUNTER — Encounter (HOSPITAL_COMMUNITY): Payer: Self-pay | Admitting: Orthopedic Surgery

## 2023-12-31 DIAGNOSIS — E559 Vitamin D deficiency, unspecified: Secondary | ICD-10-CM | POA: Diagnosis not present

## 2023-12-31 DIAGNOSIS — M19012 Primary osteoarthritis, left shoulder: Secondary | ICD-10-CM | POA: Diagnosis not present

## 2023-12-31 DIAGNOSIS — N183 Chronic kidney disease, stage 3 unspecified: Secondary | ICD-10-CM | POA: Diagnosis not present

## 2023-12-31 DIAGNOSIS — K219 Gastro-esophageal reflux disease without esophagitis: Secondary | ICD-10-CM | POA: Diagnosis not present

## 2023-12-31 DIAGNOSIS — M7918 Myalgia, other site: Secondary | ICD-10-CM | POA: Diagnosis not present

## 2023-12-31 DIAGNOSIS — E039 Hypothyroidism, unspecified: Secondary | ICD-10-CM | POA: Diagnosis not present

## 2023-12-31 DIAGNOSIS — Z86711 Personal history of pulmonary embolism: Secondary | ICD-10-CM | POA: Diagnosis not present

## 2023-12-31 DIAGNOSIS — E785 Hyperlipidemia, unspecified: Secondary | ICD-10-CM | POA: Diagnosis not present

## 2023-12-31 DIAGNOSIS — M48062 Spinal stenosis, lumbar region with neurogenic claudication: Secondary | ICD-10-CM | POA: Diagnosis not present

## 2023-12-31 DIAGNOSIS — I825Z1 Chronic embolism and thrombosis of unspecified deep veins of right distal lower extremity: Secondary | ICD-10-CM | POA: Diagnosis not present

## 2023-12-31 DIAGNOSIS — E1169 Type 2 diabetes mellitus with other specified complication: Secondary | ICD-10-CM | POA: Diagnosis not present

## 2024-01-01 DIAGNOSIS — M19012 Primary osteoarthritis, left shoulder: Secondary | ICD-10-CM | POA: Diagnosis not present

## 2024-01-01 DIAGNOSIS — Z96612 Presence of left artificial shoulder joint: Secondary | ICD-10-CM | POA: Diagnosis not present

## 2024-01-01 DIAGNOSIS — Z9889 Other specified postprocedural states: Secondary | ICD-10-CM | POA: Diagnosis not present

## 2024-01-04 DIAGNOSIS — E1169 Type 2 diabetes mellitus with other specified complication: Secondary | ICD-10-CM | POA: Diagnosis not present

## 2024-02-01 DIAGNOSIS — M19012 Primary osteoarthritis, left shoulder: Secondary | ICD-10-CM | POA: Diagnosis not present

## 2024-02-01 DIAGNOSIS — Z9889 Other specified postprocedural states: Secondary | ICD-10-CM | POA: Diagnosis not present

## 2024-02-01 DIAGNOSIS — Z96612 Presence of left artificial shoulder joint: Secondary | ICD-10-CM | POA: Diagnosis not present

## 2024-02-03 DIAGNOSIS — M25612 Stiffness of left shoulder, not elsewhere classified: Secondary | ICD-10-CM | POA: Diagnosis not present

## 2024-02-05 DIAGNOSIS — M25612 Stiffness of left shoulder, not elsewhere classified: Secondary | ICD-10-CM | POA: Diagnosis not present

## 2024-02-09 DIAGNOSIS — L739 Follicular disorder, unspecified: Secondary | ICD-10-CM | POA: Diagnosis not present

## 2024-02-09 DIAGNOSIS — B078 Other viral warts: Secondary | ICD-10-CM | POA: Diagnosis not present

## 2024-02-09 DIAGNOSIS — L2989 Other pruritus: Secondary | ICD-10-CM | POA: Diagnosis not present

## 2024-02-09 DIAGNOSIS — L538 Other specified erythematous conditions: Secondary | ICD-10-CM | POA: Diagnosis not present

## 2024-02-09 DIAGNOSIS — D225 Melanocytic nevi of trunk: Secondary | ICD-10-CM | POA: Diagnosis not present

## 2024-02-09 DIAGNOSIS — L821 Other seborrheic keratosis: Secondary | ICD-10-CM | POA: Diagnosis not present

## 2024-02-09 DIAGNOSIS — Z789 Other specified health status: Secondary | ICD-10-CM | POA: Diagnosis not present

## 2024-02-09 DIAGNOSIS — L814 Other melanin hyperpigmentation: Secondary | ICD-10-CM | POA: Diagnosis not present

## 2024-02-10 DIAGNOSIS — M25612 Stiffness of left shoulder, not elsewhere classified: Secondary | ICD-10-CM | POA: Diagnosis not present

## 2024-02-12 DIAGNOSIS — M25612 Stiffness of left shoulder, not elsewhere classified: Secondary | ICD-10-CM | POA: Diagnosis not present

## 2024-02-15 DIAGNOSIS — M25612 Stiffness of left shoulder, not elsewhere classified: Secondary | ICD-10-CM | POA: Diagnosis not present

## 2024-02-17 DIAGNOSIS — M25612 Stiffness of left shoulder, not elsewhere classified: Secondary | ICD-10-CM | POA: Diagnosis not present

## 2024-02-23 DIAGNOSIS — M25612 Stiffness of left shoulder, not elsewhere classified: Secondary | ICD-10-CM | POA: Diagnosis not present

## 2024-02-24 DIAGNOSIS — E785 Hyperlipidemia, unspecified: Secondary | ICD-10-CM | POA: Diagnosis not present

## 2024-02-24 DIAGNOSIS — N1831 Chronic kidney disease, stage 3a: Secondary | ICD-10-CM | POA: Diagnosis not present

## 2024-02-24 DIAGNOSIS — E1122 Type 2 diabetes mellitus with diabetic chronic kidney disease: Secondary | ICD-10-CM | POA: Diagnosis not present

## 2024-02-24 DIAGNOSIS — I129 Hypertensive chronic kidney disease with stage 1 through stage 4 chronic kidney disease, or unspecified chronic kidney disease: Secondary | ICD-10-CM | POA: Diagnosis not present

## 2024-02-25 DIAGNOSIS — M25612 Stiffness of left shoulder, not elsewhere classified: Secondary | ICD-10-CM | POA: Diagnosis not present

## 2024-02-29 DIAGNOSIS — M25562 Pain in left knee: Secondary | ICD-10-CM | POA: Diagnosis not present

## 2024-02-29 DIAGNOSIS — I82402 Acute embolism and thrombosis of unspecified deep veins of left lower extremity: Secondary | ICD-10-CM | POA: Diagnosis not present

## 2024-02-29 DIAGNOSIS — M79662 Pain in left lower leg: Secondary | ICD-10-CM | POA: Diagnosis not present

## 2024-02-29 DIAGNOSIS — R030 Elevated blood-pressure reading, without diagnosis of hypertension: Secondary | ICD-10-CM | POA: Diagnosis not present

## 2024-02-29 DIAGNOSIS — M25612 Stiffness of left shoulder, not elsewhere classified: Secondary | ICD-10-CM | POA: Diagnosis not present

## 2024-03-01 DIAGNOSIS — M25562 Pain in left knee: Secondary | ICD-10-CM | POA: Diagnosis not present

## 2024-03-02 DIAGNOSIS — M25612 Stiffness of left shoulder, not elsewhere classified: Secondary | ICD-10-CM | POA: Diagnosis not present

## 2024-03-07 DIAGNOSIS — M25612 Stiffness of left shoulder, not elsewhere classified: Secondary | ICD-10-CM | POA: Diagnosis not present

## 2024-03-10 DIAGNOSIS — M25562 Pain in left knee: Secondary | ICD-10-CM | POA: Diagnosis not present

## 2024-03-10 DIAGNOSIS — R030 Elevated blood-pressure reading, without diagnosis of hypertension: Secondary | ICD-10-CM | POA: Diagnosis not present

## 2024-03-10 DIAGNOSIS — M79662 Pain in left lower leg: Secondary | ICD-10-CM | POA: Diagnosis not present

## 2024-03-10 DIAGNOSIS — E785 Hyperlipidemia, unspecified: Secondary | ICD-10-CM | POA: Diagnosis not present

## 2024-03-10 DIAGNOSIS — E1169 Type 2 diabetes mellitus with other specified complication: Secondary | ICD-10-CM | POA: Diagnosis not present

## 2024-03-11 DIAGNOSIS — M25612 Stiffness of left shoulder, not elsewhere classified: Secondary | ICD-10-CM | POA: Diagnosis not present

## 2024-03-15 ENCOUNTER — Other Ambulatory Visit: Payer: Self-pay

## 2024-03-15 DIAGNOSIS — R0600 Dyspnea, unspecified: Secondary | ICD-10-CM | POA: Insufficient documentation

## 2024-03-15 DIAGNOSIS — Z87442 Personal history of urinary calculi: Secondary | ICD-10-CM | POA: Insufficient documentation

## 2024-03-15 DIAGNOSIS — L739 Follicular disorder, unspecified: Secondary | ICD-10-CM | POA: Diagnosis not present

## 2024-03-15 DIAGNOSIS — M1990 Unspecified osteoarthritis, unspecified site: Secondary | ICD-10-CM | POA: Insufficient documentation

## 2024-03-15 DIAGNOSIS — C4491 Basal cell carcinoma of skin, unspecified: Secondary | ICD-10-CM | POA: Insufficient documentation

## 2024-03-15 DIAGNOSIS — B078 Other viral warts: Secondary | ICD-10-CM | POA: Diagnosis not present

## 2024-03-15 DIAGNOSIS — E039 Hypothyroidism, unspecified: Secondary | ICD-10-CM | POA: Insufficient documentation

## 2024-03-15 DIAGNOSIS — M25612 Stiffness of left shoulder, not elsewhere classified: Secondary | ICD-10-CM | POA: Diagnosis not present

## 2024-03-16 ENCOUNTER — Encounter: Payer: Self-pay | Admitting: Cardiology

## 2024-03-16 ENCOUNTER — Ambulatory Visit: Attending: Cardiology | Admitting: Cardiology

## 2024-03-16 VITALS — BP 130/64 | HR 86 | Ht 68.0 in | Wt 182.4 lb

## 2024-03-16 DIAGNOSIS — E088 Diabetes mellitus due to underlying condition with unspecified complications: Secondary | ICD-10-CM

## 2024-03-16 DIAGNOSIS — Z86718 Personal history of other venous thrombosis and embolism: Secondary | ICD-10-CM | POA: Insufficient documentation

## 2024-03-16 DIAGNOSIS — Z86711 Personal history of pulmonary embolism: Secondary | ICD-10-CM | POA: Diagnosis not present

## 2024-03-16 DIAGNOSIS — E119 Type 2 diabetes mellitus without complications: Secondary | ICD-10-CM | POA: Insufficient documentation

## 2024-03-16 DIAGNOSIS — I10 Essential (primary) hypertension: Secondary | ICD-10-CM | POA: Diagnosis not present

## 2024-03-16 DIAGNOSIS — E782 Mixed hyperlipidemia: Secondary | ICD-10-CM | POA: Insufficient documentation

## 2024-03-16 MED ORDER — ROSUVASTATIN CALCIUM 10 MG PO TABS
10.0000 mg | ORAL_TABLET | Freq: Every day | ORAL | 3 refills | Status: AC
Start: 2024-03-16 — End: 2024-06-14

## 2024-03-16 NOTE — Progress Notes (Signed)
 Cardiology Office Note:    Date:  03/16/2024   ID:  Brandon Norman, DOB [birth year], MRN [id number]  PCP:  Keren Vicenta BRAVO, MD  Cardiologist:  Jennifer JONELLE Crape, MD   Referring MD: Keren Vicenta BRAVO, MD    ASSESSMENT:    1. Diabetes mellitus due to underlying condition with unspecified complications (HCC)   2. History of DVT (deep vein thrombosis)   3. History of pulmonary embolism   4. Mixed hyperlipidemia    PLAN:    In order of problems listed above:  Primary prevention stressed with the patient.  Importance of compliance with diet medication stressed and patient verbalized standing. Patient was advised to walk at least half an hour a day on a daily basis and he promises to do so. Essential hypertension: Lifestyle modification was urged and diet was emphasized. Diabetes mellitus: A1c is 8 and I cautioned him about this.  Diet emphasized.  He will keep 6 weeks for blood work at that time we will do a repeat test.  Risks of uncontrolled diabetes explained. Mixed dyslipidemia: He was on statin therapy in the past and he was getting leg pain.  Later he realized that the nerve block.  I will do blood work today as baseline and initiate rosuvastatin  10 mg daily.  Liver lipid check in 6 weeks. Patient will be seen in follow-up appointment in 6 months or earlier if the patient has any concerns.    Medication Adjustments/Labs and Tests Ordered: Current medicines are reviewed at length with the patient today.  Concerns regarding medicines are outlined above.  No orders of the defined types were placed in this encounter.  No orders of the defined types were placed in this encounter.    No chief complaint on file.    History of Present Illness:    Brandon Norman is a 68 y.o. male.  Patient has past medical history of pulm embolism, DVT, mixed dyslipidemia and diabetes mellitus.  He tells me that he walks 2 to 3 miles on a daily basis.  He denies any chest pain orthopnea or PND.   At the time of my evaluation, the patient is alert awake oriented and in no distress.  He is retired.  Past Medical History:  Diagnosis Date   Acute DVT (deep venous thrombosis) (HCC) 04/03/2020   Arthritis    Basal cell carcinoma    Cancer (HCC)    Chronic kidney disease    CKD3   Diabetes mellitus due to underlying condition with unspecified complications (HCC) 07/30/2015   Dyspnea    History of colon polyps 10/01/2011   tubular adenomas   History of DVT (deep vein thrombosis) 10/27/2022   History of kidney stones    History of pulmonary embolism 02/03/2019   Hyperlipidemia 04/07/2017   Hypothyroidism    Lumbar spinal stenosis 08/07/2023   Myalgia 04/17/2023   Pulmonary embolism (HCC) 2015   Spinal stenosis of lumbar region 06/19/2023   Thyroid  disease 10/21/2022    Past Surgical History:  Procedure Laterality Date   COLONOSCOPY  11/28/2015   Colonic polyps status post polypectomy. Mild colonic diverticulosis.    COLONOSCOPY  12/26/2020   LUMBAR LAMINECTOMY/DECOMPRESSION MICRODISCECTOMY N/A 08/07/2023   Procedure: OPEN LUMBAR LAMINECTOMY, MEDIAL FACETECTOMIES LUMBAR TWO-THREE;  Surgeon: Carollee Lani BROCKS, DO;  Location: [facility] OR;  Service: Neurosurgery;  Laterality: N/A;  3C   Mohs surgery for skin cancer      TOTAL SHOULDER ARTHROPLASTY Left 12/17/2023   Procedure: ARTHROPLASTY, SHOULDER, TOTAL;  Surgeon: Dozier Soulier, MD;  Location: [facility];  Service: Orthopedics;  Laterality: Left;   WISDOM TOOTH EXTRACTION  1992    Current Medications: Current Meds  Medication Sig   acetaminophen  (TYLENOL ) 500 MG tablet Take 500-1,000 mg by mouth every 6 (six) hours as needed (pain.).   amLODipine (NORVASC) 2.5 MG tablet Take 2.5 mg by mouth daily.   Cholecalciferol (VITAMIN D3 PO) Take 5,000 Units by mouth in the morning.   ezetimibe  (ZETIA ) 10 MG tablet Take 10 mg by mouth every evening.   fenofibrate  160 MG tablet Take 160 mg by mouth every evening.   glimepiride  (AMARYL ) 2 MG  tablet Take 2 mg by mouth every evening.   levothyroxine  (SYNTHROID ) 75 MCG tablet Take 75 mcg by mouth daily before breakfast.   Omega-3 Fatty Acids (FISH OIL ) 1000 MG CAPS Take 2 capsules (2,000 mg total) by mouth 2 (two) times daily.   rivaroxaban (XARELTO) 20 MG TABS tablet Take 20 mg by mouth daily with breakfast.   traMADol (ULTRAM) 50 MG tablet Take 50 mg by mouth every 6 (six) hours as needed for severe pain (pain score 7-10) or moderate pain (pain score 4-6).   TRULICITY  3 MG/0.5ML SOAJ Inject 3 mg into the skin once a week.     Allergies:   Other and Hibiclens  [chlorhexidine  gluconate]   Social History   Socioeconomic History   Marital status: Married    Spouse name: Leonor   Number of children: 1   Years of education: Not on file   Highest education level: Not on file  Occupational History   Not on file  Tobacco Use   Smoking status: Never    Passive exposure: Past   Smokeless tobacco: Never  Vaping Use   Vaping status: Never Used  Substance and Sexual Activity   Alcohol use: Never    Comment: Rarely   Drug use: No   Sexual activity: Yes  Other Topics Concern   Not on file  Social History Narrative   Not on file   Social Drivers of Health   Financial Resource Strain: Not on file  Food Insecurity: Not on file  Transportation Needs: Not on file  Physical Activity: Not on file  Stress: Not on file  Social Connections: Not on file     Family History: The patient's family history includes Heart attack in his mother; Heart disease in his mother; Thyroid  disease in his sister and sister. There is no history of Colon cancer, Esophageal cancer, Rectal cancer, or Stomach cancer.  ROS:   Please see the history of present illness.    All other systems reviewed and are negative.  EKGs/Labs/Other Studies Reviewed:    The following studies were reviewed today:    I discussed my findings with the patient at length    Recent Labs: 12/09/2023: BUN 24; Creatinine,  Ser 1.54; Hemoglobin 17.8; Platelets 259; Potassium 4.4; Sodium 139  Recent Lipid Panel No results found for: CHOL, TRIG, HDL, CHOLHDL, VLDL, LDLCALC, LDLDIRECT  Physical Exam:    VS:  BP 130/64   Pulse 86   Ht 5' 8 (1.727 m)   Wt 182 lb 6.4 oz (82.7 kg)   SpO2 96%   BMI 27.73 kg/m     Wt Readings from Last 3 Encounters:  03/16/24 182 lb 6.4 oz (82.7 kg)  12/09/23 181 lb (82.1 kg)  08/07/23 179 lb (81.2 kg)     GEN: Patient is in no acute distress HEENT: Normal NECK: No JVD;  No carotid bruits LYMPHATICS: No lymphadenopathy CARDIAC: Hear sounds regular, 2/6 systolic murmur at the apex. RESPIRATORY:  Clear to auscultation without rales, wheezing or rhonchi  ABDOMEN: Soft, non-tender, non-distended MUSCULOSKELETAL:  No edema; No deformity  SKIN: Warm and dry NEUROLOGIC:  Alert and oriented x 3 PSYCHIATRIC:  Normal affect   Signed, Jennifer JONELLE Crape, MD  03/16/2024 9:45 AM    [facility]

## 2024-03-16 NOTE — Patient Instructions (Signed)
 Medication Instructions:  Your physician has recommended you make the following change in your medication:   Start Crestor  10 mg daily.  *If you need a refill on your cardiac medications before your next appointment, please call your pharmacy*   Lab Work: Your physician recommends that you have a CMP today in the office.  Your physician recommends that you return for lab work in: 6 weeks for CMP, lipids and A1C You need to have labs done when you are fasting.  You can come Monday through Friday 8:30 am to 12:00 pm and 1:15 to 4:30. You do not need to make an appointment as the order has already been placed.   If you have labs (blood work) drawn today and your tests are completely normal, you will receive your results only by: MyChart Message (if you have MyChart) OR A paper copy in the mail If you have any lab test that is abnormal or we need to change your treatment, we will call you to review the results.   Testing/Procedures: None ordered   Follow-Up: At [facility], you and your health needs are our priority.  As part of our continuing mission to provide you with exceptional heart care, we have created designated Provider Care Teams.  These Care Teams include your primary Cardiologist (physician) and Advanced Practice Providers (APPs -  Physician Assistants and Nurse Practitioners) who all work together to provide you with the care you need, when you need it.  We recommend signing up for the patient portal called MyChart.  Sign up information is provided on this After Visit Summary.  MyChart is used to connect with patients for Virtual Visits (Telemedicine).  Patients are able to view lab/test results, encounter notes, upcoming appointments, etc.  Non-urgent messages can be sent to your provider as well.   To learn more about what you can do with MyChart, go to [email or web address].    Your next appointment:   9 month(s)  The format for your next appointment:   In  Person  Provider:   Jennifer Crape, MD    Other Instructions none  Important Information About Sugar

## 2024-03-17 ENCOUNTER — Ambulatory Visit: Payer: Self-pay | Admitting: Cardiology

## 2024-03-17 DIAGNOSIS — M25612 Stiffness of left shoulder, not elsewhere classified: Secondary | ICD-10-CM | POA: Diagnosis not present

## 2024-03-17 LAB — COMPREHENSIVE METABOLIC PANEL WITH GFR
ALT: 55 IU/L — ABNORMAL HIGH (ref 0–44)
AST: 32 IU/L (ref 0–40)
Albumin: 4.2 g/dL (ref 3.9–4.9)
Alkaline Phosphatase: 68 IU/L (ref 44–121)
BUN/Creatinine Ratio: 16 (ref 10–24)
BUN: 21 mg/dL (ref 8–27)
Bilirubin Total: 0.6 mg/dL (ref 0.0–1.2)
CO2: 20 mmol/L (ref 20–29)
Calcium: 9.6 mg/dL (ref 8.6–10.2)
Chloride: 103 mmol/L (ref 96–106)
Creatinine, Ser: 1.35 mg/dL — ABNORMAL HIGH (ref 0.76–1.27)
Globulin, Total: 2.6 g/dL (ref 1.5–4.5)
Glucose: 237 mg/dL — ABNORMAL HIGH (ref 70–99)
Potassium: 4.8 mmol/L (ref 3.5–5.2)
Sodium: 139 mmol/L (ref 134–144)
Total Protein: 6.8 g/dL (ref 6.0–8.5)
eGFR: 56 mL/min/1.73 — ABNORMAL LOW (ref >59)

## 2024-03-21 DIAGNOSIS — M25612 Stiffness of left shoulder, not elsewhere classified: Secondary | ICD-10-CM | POA: Diagnosis not present

## 2024-03-21 DIAGNOSIS — M25512 Pain in left shoulder: Secondary | ICD-10-CM | POA: Diagnosis not present

## 2024-03-24 DIAGNOSIS — M25612 Stiffness of left shoulder, not elsewhere classified: Secondary | ICD-10-CM | POA: Diagnosis not present

## 2024-03-24 DIAGNOSIS — R030 Elevated blood-pressure reading, without diagnosis of hypertension: Secondary | ICD-10-CM | POA: Diagnosis not present

## 2024-03-25 DIAGNOSIS — M1712 Unilateral primary osteoarthritis, left knee: Secondary | ICD-10-CM | POA: Diagnosis not present

## 2024-03-29 DIAGNOSIS — M25612 Stiffness of left shoulder, not elsewhere classified: Secondary | ICD-10-CM | POA: Diagnosis not present

## 2024-03-31 DIAGNOSIS — M25612 Stiffness of left shoulder, not elsewhere classified: Secondary | ICD-10-CM | POA: Diagnosis not present

## 2024-04-06 DIAGNOSIS — M4726 Other spondylosis with radiculopathy, lumbar region: Secondary | ICD-10-CM | POA: Diagnosis not present

## 2024-04-06 DIAGNOSIS — M48062 Spinal stenosis, lumbar region with neurogenic claudication: Secondary | ICD-10-CM | POA: Diagnosis not present

## 2024-04-06 DIAGNOSIS — Z6827 Body mass index (BMI) 27.0-27.9, adult: Secondary | ICD-10-CM | POA: Diagnosis not present

## 2024-04-13 DIAGNOSIS — I129 Hypertensive chronic kidney disease with stage 1 through stage 4 chronic kidney disease, or unspecified chronic kidney disease: Secondary | ICD-10-CM | POA: Diagnosis not present

## 2024-04-13 DIAGNOSIS — E785 Hyperlipidemia, unspecified: Secondary | ICD-10-CM | POA: Diagnosis not present

## 2024-04-13 DIAGNOSIS — E1169 Type 2 diabetes mellitus with other specified complication: Secondary | ICD-10-CM | POA: Diagnosis not present

## 2024-04-13 DIAGNOSIS — E039 Hypothyroidism, unspecified: Secondary | ICD-10-CM | POA: Diagnosis not present

## 2024-04-13 DIAGNOSIS — K219 Gastro-esophageal reflux disease without esophagitis: Secondary | ICD-10-CM | POA: Diagnosis not present

## 2024-04-13 DIAGNOSIS — M7918 Myalgia, other site: Secondary | ICD-10-CM | POA: Diagnosis not present

## 2024-04-13 DIAGNOSIS — N183 Chronic kidney disease, stage 3 unspecified: Secondary | ICD-10-CM | POA: Diagnosis not present

## 2024-04-13 DIAGNOSIS — M19012 Primary osteoarthritis, left shoulder: Secondary | ICD-10-CM | POA: Diagnosis not present

## 2024-04-13 DIAGNOSIS — E559 Vitamin D deficiency, unspecified: Secondary | ICD-10-CM | POA: Diagnosis not present

## 2024-04-13 DIAGNOSIS — I825Z1 Chronic embolism and thrombosis of unspecified deep veins of right distal lower extremity: Secondary | ICD-10-CM | POA: Diagnosis not present

## 2024-04-13 DIAGNOSIS — Z23 Encounter for immunization: Secondary | ICD-10-CM | POA: Diagnosis not present

## 2024-04-13 DIAGNOSIS — Z86711 Personal history of pulmonary embolism: Secondary | ICD-10-CM | POA: Diagnosis not present

## 2024-04-13 DIAGNOSIS — M48062 Spinal stenosis, lumbar region with neurogenic claudication: Secondary | ICD-10-CM | POA: Diagnosis not present

## 2024-04-14 DIAGNOSIS — H5203 Hypermetropia, bilateral: Secondary | ICD-10-CM | POA: Diagnosis not present

## 2024-04-14 DIAGNOSIS — E119 Type 2 diabetes mellitus without complications: Secondary | ICD-10-CM | POA: Diagnosis not present

## 2024-04-14 DIAGNOSIS — H25813 Combined forms of age-related cataract, bilateral: Secondary | ICD-10-CM | POA: Diagnosis not present

## 2024-04-14 DIAGNOSIS — H43812 Vitreous degeneration, left eye: Secondary | ICD-10-CM | POA: Diagnosis not present

## 2024-04-14 DIAGNOSIS — D3132 Benign neoplasm of left choroid: Secondary | ICD-10-CM | POA: Diagnosis not present

## 2024-04-14 DIAGNOSIS — H524 Presbyopia: Secondary | ICD-10-CM | POA: Diagnosis not present

## 2024-04-19 DIAGNOSIS — M4726 Other spondylosis with radiculopathy, lumbar region: Secondary | ICD-10-CM | POA: Diagnosis not present

## 2024-04-19 DIAGNOSIS — M51369 Other intervertebral disc degeneration, lumbar region without mention of lumbar back pain or lower extremity pain: Secondary | ICD-10-CM | POA: Diagnosis not present

## 2024-04-19 DIAGNOSIS — M5126 Other intervertebral disc displacement, lumbar region: Secondary | ICD-10-CM | POA: Diagnosis not present

## 2024-04-19 DIAGNOSIS — M48061 Spinal stenosis, lumbar region without neurogenic claudication: Secondary | ICD-10-CM | POA: Diagnosis not present

## 2024-04-29 DIAGNOSIS — Z6827 Body mass index (BMI) 27.0-27.9, adult: Secondary | ICD-10-CM | POA: Diagnosis not present

## 2024-04-29 DIAGNOSIS — M48062 Spinal stenosis, lumbar region with neurogenic claudication: Secondary | ICD-10-CM | POA: Diagnosis not present
